# Patient Record
Sex: Female | Born: 1955 | Race: White | Hispanic: No | Marital: Married | Smoking: Never smoker
Health system: Southern US, Community
[De-identification: ages and names within clinical notes are randomized; demographics above are authoritative.]

## PROBLEM LIST (undated history)

## (undated) DIAGNOSIS — F32A Depression, unspecified: Secondary | ICD-10-CM

## (undated) DIAGNOSIS — F329 Major depressive disorder, single episode, unspecified: Secondary | ICD-10-CM

## (undated) DIAGNOSIS — G43109 Migraine with aura, not intractable, without status migrainosus: Secondary | ICD-10-CM

## (undated) DIAGNOSIS — M1990 Unspecified osteoarthritis, unspecified site: Secondary | ICD-10-CM

## (undated) HISTORY — DX: Depression, unspecified: F32.A

## (undated) HISTORY — DX: Major depressive disorder, single episode, unspecified: F32.9

## (undated) HISTORY — DX: Unspecified osteoarthritis, unspecified site: M19.90

## (undated) HISTORY — DX: Migraine with aura, not intractable, without status migrainosus: G43.109

---

## 1979-01-21 HISTORY — PX: CERVICAL CONE BIOPSY: SUR198

## 1997-08-26 ENCOUNTER — Emergency Department (HOSPITAL_COMMUNITY): Admission: EM | Admit: 1997-08-26 | Discharge: 1997-08-27 | Payer: Self-pay | Admitting: Emergency Medicine

## 1997-12-07 ENCOUNTER — Other Ambulatory Visit: Admission: RE | Admit: 1997-12-07 | Discharge: 1997-12-07 | Payer: Self-pay | Admitting: Obstetrics and Gynecology

## 1998-12-12 ENCOUNTER — Encounter (INDEPENDENT_AMBULATORY_CARE_PROVIDER_SITE_OTHER): Payer: Self-pay | Admitting: Specialist

## 1998-12-12 ENCOUNTER — Other Ambulatory Visit: Admission: RE | Admit: 1998-12-12 | Discharge: 1998-12-12 | Payer: Self-pay | Admitting: Obstetrics and Gynecology

## 1999-02-12 ENCOUNTER — Other Ambulatory Visit: Admission: RE | Admit: 1999-02-12 | Discharge: 1999-02-12 | Payer: Self-pay | Admitting: Obstetrics and Gynecology

## 1999-07-12 ENCOUNTER — Encounter: Admission: RE | Admit: 1999-07-12 | Discharge: 1999-07-12 | Payer: Self-pay | Admitting: Obstetrics and Gynecology

## 1999-07-12 ENCOUNTER — Encounter: Payer: Self-pay | Admitting: Obstetrics and Gynecology

## 2001-03-30 ENCOUNTER — Encounter: Payer: Self-pay | Admitting: Family Medicine

## 2001-03-30 ENCOUNTER — Encounter: Admission: RE | Admit: 2001-03-30 | Discharge: 2001-03-30 | Payer: Self-pay | Admitting: Family Medicine

## 2002-04-26 ENCOUNTER — Encounter: Payer: Self-pay | Admitting: Family Medicine

## 2002-04-26 ENCOUNTER — Encounter: Admission: RE | Admit: 2002-04-26 | Discharge: 2002-04-26 | Payer: Self-pay | Admitting: Family Medicine

## 2002-05-09 ENCOUNTER — Encounter: Payer: Self-pay | Admitting: Family Medicine

## 2002-05-09 ENCOUNTER — Encounter: Admission: RE | Admit: 2002-05-09 | Discharge: 2002-05-09 | Payer: Self-pay | Admitting: Family Medicine

## 2002-10-27 ENCOUNTER — Encounter: Admission: RE | Admit: 2002-10-27 | Discharge: 2002-10-27 | Payer: Self-pay | Admitting: Family Medicine

## 2002-10-27 ENCOUNTER — Encounter: Payer: Self-pay | Admitting: Family Medicine

## 2003-02-05 LAB — HM DEXA SCAN: HM Dexa Scan: NORMAL

## 2003-05-10 ENCOUNTER — Encounter: Admission: RE | Admit: 2003-05-10 | Discharge: 2003-05-10 | Payer: Self-pay | Admitting: Family Medicine

## 2003-05-26 ENCOUNTER — Encounter (HOSPITAL_COMMUNITY): Admission: RE | Admit: 2003-05-26 | Discharge: 2003-08-24 | Payer: Self-pay | Admitting: Family Medicine

## 2003-07-21 HISTORY — PX: ENDOMETRIAL ABLATION: SHX621

## 2003-07-21 HISTORY — PX: KNEE ARTHROSCOPY: SUR90

## 2003-08-18 ENCOUNTER — Ambulatory Visit (HOSPITAL_COMMUNITY): Admission: RE | Admit: 2003-08-18 | Discharge: 2003-08-18 | Payer: Self-pay | Admitting: Obstetrics and Gynecology

## 2003-08-18 ENCOUNTER — Ambulatory Visit (HOSPITAL_BASED_OUTPATIENT_CLINIC_OR_DEPARTMENT_OTHER): Admission: RE | Admit: 2003-08-18 | Discharge: 2003-08-18 | Payer: Self-pay | Admitting: Obstetrics and Gynecology

## 2004-09-03 ENCOUNTER — Encounter: Admission: RE | Admit: 2004-09-03 | Discharge: 2004-09-03 | Payer: Self-pay | Admitting: Family Medicine

## 2005-09-05 ENCOUNTER — Encounter: Admission: RE | Admit: 2005-09-05 | Discharge: 2005-09-05 | Payer: Self-pay | Admitting: Obstetrics and Gynecology

## 2005-09-23 ENCOUNTER — Other Ambulatory Visit: Admission: RE | Admit: 2005-09-23 | Discharge: 2005-09-23 | Payer: Self-pay | Admitting: Obstetrics & Gynecology

## 2006-10-05 ENCOUNTER — Other Ambulatory Visit: Admission: RE | Admit: 2006-10-05 | Discharge: 2006-10-05 | Payer: Self-pay | Admitting: Obstetrics & Gynecology

## 2006-10-09 ENCOUNTER — Encounter: Admission: RE | Admit: 2006-10-09 | Discharge: 2006-10-09 | Payer: Self-pay | Admitting: Obstetrics and Gynecology

## 2007-02-05 LAB — HM COLONOSCOPY: HM Colonoscopy: NORMAL

## 2007-10-07 ENCOUNTER — Other Ambulatory Visit: Admission: RE | Admit: 2007-10-07 | Discharge: 2007-10-07 | Payer: Self-pay | Admitting: Obstetrics & Gynecology

## 2007-10-19 ENCOUNTER — Encounter: Admission: RE | Admit: 2007-10-19 | Discharge: 2007-10-19 | Payer: Self-pay | Admitting: Obstetrics and Gynecology

## 2008-05-12 DIAGNOSIS — E559 Vitamin D deficiency, unspecified: Secondary | ICD-10-CM | POA: Insufficient documentation

## 2008-08-25 ENCOUNTER — Encounter: Admission: RE | Admit: 2008-08-25 | Discharge: 2008-08-25 | Payer: Self-pay | Admitting: Family Medicine

## 2008-10-19 ENCOUNTER — Encounter: Admission: RE | Admit: 2008-10-19 | Discharge: 2008-10-19 | Payer: Self-pay | Admitting: Family Medicine

## 2008-10-27 ENCOUNTER — Encounter: Admission: RE | Admit: 2008-10-27 | Discharge: 2008-10-27 | Payer: Self-pay | Admitting: Family Medicine

## 2009-01-15 DIAGNOSIS — F331 Major depressive disorder, recurrent, moderate: Secondary | ICD-10-CM | POA: Insufficient documentation

## 2009-11-14 ENCOUNTER — Encounter: Admission: RE | Admit: 2009-11-14 | Discharge: 2009-11-14 | Payer: Self-pay | Admitting: Family Medicine

## 2009-11-28 ENCOUNTER — Encounter: Admission: RE | Admit: 2009-11-28 | Discharge: 2009-11-28 | Payer: Self-pay | Admitting: Family Medicine

## 2009-11-29 ENCOUNTER — Ambulatory Visit (HOSPITAL_BASED_OUTPATIENT_CLINIC_OR_DEPARTMENT_OTHER): Admission: RE | Admit: 2009-11-29 | Discharge: 2009-11-29 | Payer: Self-pay | Admitting: Urology

## 2009-11-29 HISTORY — PX: BLADDER SUSPENSION: SHX72

## 2010-02-09 ENCOUNTER — Encounter: Payer: Self-pay | Admitting: Family Medicine

## 2010-02-10 ENCOUNTER — Encounter: Payer: Self-pay | Admitting: Family Medicine

## 2010-02-11 ENCOUNTER — Encounter: Payer: Self-pay | Admitting: Family Medicine

## 2010-03-28 ENCOUNTER — Other Ambulatory Visit: Payer: Self-pay | Admitting: Obstetrics and Gynecology

## 2010-03-28 DIAGNOSIS — N6459 Other signs and symptoms in breast: Secondary | ICD-10-CM

## 2010-04-02 LAB — ABO/RH: ABO/RH(D): A POS

## 2010-04-02 LAB — TYPE AND SCREEN
ABO/RH(D): A POS
Antibody Screen: NEGATIVE

## 2010-04-02 LAB — CBC
HCT: 41.9 % (ref 36.0–46.0)
Hemoglobin: 14.6 g/dL (ref 12.0–15.0)
MCH: 31.6 pg (ref 26.0–34.0)
MCHC: 34.9 g/dL (ref 30.0–36.0)
MCV: 90.5 fL (ref 78.0–100.0)
Platelets: 300 10*3/uL (ref 150–400)
RBC: 4.63 MIL/uL (ref 3.87–5.11)
RDW: 13.7 % (ref 11.5–15.5)
WBC: 6.1 10*3/uL (ref 4.0–10.5)

## 2010-04-02 LAB — PROTIME-INR
INR: 1.05 (ref 0.00–1.49)
Prothrombin Time: 13.9 seconds (ref 11.6–15.2)

## 2010-04-02 LAB — APTT: aPTT: 30 seconds (ref 24–37)

## 2010-04-05 ENCOUNTER — Ambulatory Visit
Admission: RE | Admit: 2010-04-05 | Discharge: 2010-04-05 | Disposition: A | Discharge status: Home / Self Care | Payer: BC Managed Care – PPO | Source: Ambulatory Visit | Attending: Obstetrics and Gynecology | Admitting: Obstetrics and Gynecology

## 2010-04-05 ENCOUNTER — Other Ambulatory Visit: Payer: Self-pay | Admitting: Obstetrics and Gynecology

## 2010-04-05 DIAGNOSIS — N6459 Other signs and symptoms in breast: Secondary | ICD-10-CM

## 2010-06-05 LAB — HM PAP SMEAR: HM Pap smear: NORMAL

## 2010-06-05 LAB — HM MAMMOGRAPHY: HM Mammogram: NORMAL

## 2010-06-07 NOTE — Op Note (Signed)
NAME:  Amy Davila, Amy Davila                         ACCOUNT NO.:  [id number]   MEDICAL RECORD NO.:  [id number]                   PATIENT TYPE:  AMB   LOCATION:  [facility]                                 FACILITY:  [facility]   PHYSICIAN:  Laqueta Linden, M.D.                 DATE OF BIRTH:  [birth year]   DATE OF PROCEDURE:  08/18/2003  DATE OF DISCHARGE:                                 OPERATIVE REPORT   PREOPERATIVE DIAGNOSES:  Menorrhagia.   POSTOPERATIVE DIAGNOSES:  Menorrhagia.   PROCEDURE:  Hydrothermal ablation.   SURGEON:  Laqueta Linden, M.D.   ANESTHESIA:  General LMA.   ESTIMATED BLOOD LOSS:  Less than 10 mL.   SORBITOL NET INTAKE:  Zero.   SPECIMENS:  None.   COMPLICATIONS:  None.   INDICATIONS FOR PROCEDURE:  Amy Davila is a 55 year old, para 2,  perimenopausal woman who presented with severe menometrorrhagia consistent  with anovulatory cycles.  She responded to high dose progesterone therapy  with cessation of her bleeding. She had mild anemia with a hemoglobin of  11.5. She reported that this had been going on for the past several months  and getting gradually worse. She was evaluated by office endometrial  sampling which revealed benign disorder proliferation and a small fragment  of a benign polyp. Pelvic ultrasound with sonohysterogram revealed a  retroverted uterus with a suggestion of endometrial polyps which was  confirmed by sonohysterogram revealing two broad based polyps on the  anterior surface of the endometrium measuring 19 x 9 x 13 mm and 18 x 10 x  15 mm.  The patient was offered alternatives of hysteroscopic resection of  the polyps versus endometrial ablation followed by curettage is necessary  and chose the latter. She received Lupron in the luteal phase of her cycle  one month ago and presents now for surgical management. She has seen the  informed consent film, voiced her understanding and acceptance of all risks,  benefits, alternatives and  complications including but not limited to  anesthesia risks, infection, bleeding, uterine perforation, possibility of  incomplete or temporary relief of symptoms with the risk of recurrent  symptoms as well as erratic bleeding related to her age which may get worse  in the future. She has seen the informed consent film regarding operative  hysteroscopy and agrees to proceed.   DESCRIPTION OF PROCEDURE:  The patient was taken to the operating room and  after proper identification and consents were ascertains, she was placed on  the operating table in the supine position. After general LMA was  introduced, she was placed in the [place] stirrups and the perineum and vagina  were prepped and draped in routine sterile fashion. The bladder was emptied  with a red rubber catheter. The uterus on bimanual was noted to be acutely  retroverted, parous in size and mobile. A speculum was placed in the vagina  and the  anterior lip of the cervix was grasped with a single tooth  tenaculum. The internal os was noted to deviated sharply posteriorly and was  gently dilated to a #21 Pratt dilator. The HTA scope was inserted under  direct vision with continuous sorbitol infusion and was a snug fit through  the internal os. Both tubal ostia were visualized. The entire endometrial  cavity was visualized, there was no evidence of a defined polyp and it was  felt that perhaps the Lupron had resolved these. In addition, the patient  gave a history of passing a large piece of tissue during her last bleeding  episode which sounded like at least one of the polyps had been passed  spontaneously. In any event, the endometrial cavity appeared quite atrophic  without focal lesions and well prepared for the surgery. The fluid was  introduced and appropriate pressure measurements were taken to confirm an  intact uterine cavity with no evidence of perforation.  Once this was  confirmed then the fluid was heated to 90 degrees  Celsius and a 10 minute  ablation procedure was performed. There was a very slight drop in the fluid  of approximately 1 mL per minute and this was felt to be due to the uterine  expansion and possibly slight movement of the scope. There was no evidence  at any time of any passage of fluid back through the cervix which was  watched continuously throughout the procedure. At the conclusion of the 10  minute ablation procedure, the fluid was cooled, the scope was moved around  and the entire cavity was felt to have an excellent ablation. Again there  were no focal lesions or polyps identified. The scope was then withdrawn,  the tenaculum site had a slight amount of oozing which responded to local  pressure. All instruments were removed, the patient was awake and stable on  transfer to the PACU.  She received Toradol 30 mg IV, 30 mg IM.  Intraoperatively she will be observed and discharged per anesthesia. She is  to followup in the office in 2-3 weeks time. She is to take Advil or Aleve  as needed for any discomfort and to call for excessive pain, fever, bleeding  or other concerns. She was given routine verbal and written discharge  instructions.                                               Laqueta Linden, M.D.    LKS/MEDQ  D:  08/18/2003  T:  08/18/2003  Job:  [id number]   cc:   Elizabeth Palau, FNP

## 2010-09-05 ENCOUNTER — Encounter: Payer: Self-pay | Admitting: Family Medicine

## 2010-09-05 ENCOUNTER — Ambulatory Visit (INDEPENDENT_AMBULATORY_CARE_PROVIDER_SITE_OTHER): Payer: BC Managed Care – PPO | Admitting: Family Medicine

## 2010-09-05 VITALS — BP 102/72 | HR 72 | Temp 98.1°F | Resp 12 | Ht 64.0 in | Wt 170.0 lb

## 2010-09-05 DIAGNOSIS — Z0000 Encounter for general adult medical examination without abnormal findings: Secondary | ICD-10-CM

## 2010-09-05 LAB — CBC WITH DIFFERENTIAL/PLATELET
Basophils Absolute: 0.1 10*3/uL (ref 0.0–0.1)
Basophils Relative: 1.4 % (ref 0.0–3.0)
Eosinophils Absolute: 0.3 10*3/uL (ref 0.0–0.7)
Eosinophils Relative: 4.6 % (ref 0.0–5.0)
HCT: 44.1 % (ref 36.0–46.0)
Hemoglobin: 14.6 g/dL (ref 12.0–15.0)
Lymphocytes Relative: 27.8 % (ref 12.0–46.0)
Lymphs Abs: 1.7 10*3/uL (ref 0.7–4.0)
MCHC: 33.2 g/dL (ref 30.0–36.0)
MCV: 92.1 fl (ref 78.0–100.0)
Monocytes Absolute: 0.5 10*3/uL (ref 0.1–1.0)
Monocytes Relative: 8.7 % (ref 3.0–12.0)
Neutro Abs: 3.5 10*3/uL (ref 1.4–7.7)
Neutrophils Relative %: 57.5 % (ref 43.0–77.0)
Platelets: 273 10*3/uL (ref 150.0–400.0)
RBC: 4.78 Mil/uL (ref 3.87–5.11)
RDW: 13.4 % (ref 11.5–14.6)
WBC: 6.2 10*3/uL (ref 4.5–10.5)

## 2010-09-05 LAB — HEPATIC FUNCTION PANEL
ALT: 20 U/L (ref 0–35)
AST: 19 U/L (ref 0–37)
Albumin: 4.4 g/dL (ref 3.5–5.2)
Alkaline Phosphatase: 79 U/L (ref 39–117)
Bilirubin, Direct: 0 mg/dL (ref 0.0–0.3)
Total Bilirubin: 0.4 mg/dL (ref 0.3–1.2)
Total Protein: 7.1 g/dL (ref 6.0–8.3)

## 2010-09-05 LAB — POCT URINALYSIS DIPSTICK
Bilirubin, UA: NEGATIVE
Glucose, UA: NEGATIVE
Ketones, UA: NEGATIVE
Nitrite, UA: NEGATIVE
Protein, UA: NEGATIVE
Spec Grav, UA: 1.015
Urobilinogen, UA: 0.2
pH, UA: 8.5

## 2010-09-05 LAB — BASIC METABOLIC PANEL
BUN: 17 mg/dL (ref 6–23)
CO2: 27 mEq/L (ref 19–32)
Calcium: 8.9 mg/dL (ref 8.4–10.5)
Chloride: 101 mEq/L (ref 96–112)
Creatinine, Ser: 0.8 mg/dL (ref 0.4–1.2)
GFR: 84.49 mL/min (ref >60.00)
Glucose, Bld: 84 mg/dL (ref 70–99)
Potassium: 4.3 mEq/L (ref 3.5–5.1)
Sodium: 136 mEq/L (ref 135–145)

## 2010-09-05 LAB — TSH: TSH: 1.38 u[IU]/mL (ref 0.35–5.50)

## 2010-09-05 LAB — LIPID PANEL
Cholesterol: 192 mg/dL (ref 0–200)
HDL: 96.2 mg/dL (ref >39.00)
LDL Cholesterol: 84 mg/dL (ref 0–99)
Total CHOL/HDL Ratio: 2
Triglycerides: 59 mg/dL (ref 0.0–149.0)
VLDL: 11.8 mg/dL (ref 0.0–40.0)

## 2010-09-05 NOTE — Progress Notes (Signed)
  Subjective:    Patient ID: Amy Davila, female    DOB: 06-Oct-1955, 55 y.o.   MRN: [id number]  HPI New to establish care. Patient sees gynecologist regularly for physicals. She is here for wellness exam. Past medical history reviewed. She has some mild osteoarthritis and history of depression which has been stable. Husband died from motor vehicle accident over one year ago. She's had extensive counseling and is exercising and taking antidepressant medications with good success. She continues to work full-time. Allergy to sulfa.  Surgical history previous left knee arthroscopy. She had conization procedure for abnormal Pap in her teen years.  Family history significant for father atrial fibrillation type 2 diabetes. Hypertension in several family members as well as osteoarthritis.  She is widowed. Works as an Airline pilot. 2 children. Nonsmoker. Occasional alcohol use.  Last tetanus 2007. Colonoscopy 2009   Review of Systems  Constitutional: Negative for fever, activity change, appetite change, fatigue and unexpected weight change.  HENT: Negative for hearing loss, ear pain, sore throat and trouble swallowing.   Eyes: Negative for visual disturbance.  Respiratory: Negative for cough and shortness of breath.   Cardiovascular: Negative for chest pain and palpitations.  Gastrointestinal: Negative for abdominal pain, diarrhea, constipation and blood in stool.  Genitourinary: Negative for dysuria and hematuria.  Musculoskeletal: Negative for myalgias, back pain and arthralgias.  Skin: Negative for rash.  Neurological: Negative for dizziness, syncope and headaches.  Hematological: Negative for adenopathy.  Psychiatric/Behavioral: Negative for confusion and dysphoric mood.       Objective:   Physical Exam  Constitutional: She is oriented to person, place, and time. She appears well-developed and well-nourished.  HENT:  Head: Normocephalic and atraumatic.  Eyes: EOM are normal. Pupils are  equal, round, and reactive to light.  Neck: Normal range of motion. Neck supple. No thyromegaly present.  Cardiovascular: Normal rate, regular rhythm and normal heart sounds.   No murmur heard. Pulmonary/Chest: Breath sounds normal. No respiratory distress. She has no wheezes. She has no rales.  Abdominal: Soft. Bowel sounds are normal. She exhibits no distension and no mass. There is no tenderness. There is no rebound and no guarding.  Genitourinary:       As per GYN  Musculoskeletal: Normal range of motion. She exhibits no edema.  Lymphadenopathy:    She has no cervical adenopathy.  Neurological: She is alert and oriented to person, place, and time. She displays normal reflexes. No cranial nerve deficit.  Skin: No rash noted.  Psychiatric: She has a normal mood and affect. Her behavior is normal. Judgment and thought content normal.          Assessment & Plan:  Wellness exam. Obtain screening lab work. Immunizations up-to-date. Continue GYN followup. Colonoscopy up to date. Discussed exercise and weight loss.

## 2010-09-06 NOTE — Progress Notes (Signed)
Quick Note:  Pt informed on personally identified VM ______ 

## 2010-11-25 ENCOUNTER — Telehealth: Payer: Self-pay | Admitting: Family Medicine

## 2010-11-25 MED ORDER — CITALOPRAM HYDROBROMIDE 40 MG PO TABS: 40.0000 mg | ORAL_TABLET | Freq: Every day | ORAL | Status: DC

## 2010-11-25 NOTE — Telephone Encounter (Signed)
Pt requesting refill on citalopram (CELEXA) 40 MG tablet  [facility]

## 2011-07-16 ENCOUNTER — Ambulatory Visit (INDEPENDENT_AMBULATORY_CARE_PROVIDER_SITE_OTHER): Payer: BC Managed Care – PPO | Admitting: Family Medicine

## 2011-07-16 ENCOUNTER — Encounter: Payer: Self-pay | Admitting: Family Medicine

## 2011-07-16 ENCOUNTER — Telehealth: Payer: Self-pay | Admitting: Family Medicine

## 2011-07-16 VITALS — BP 120/82 | Temp 98.1°F | Wt 184.0 lb

## 2011-07-16 DIAGNOSIS — R5383 Other fatigue: Secondary | ICD-10-CM

## 2011-07-16 DIAGNOSIS — R071 Chest pain on breathing: Secondary | ICD-10-CM

## 2011-07-16 DIAGNOSIS — R0789 Other chest pain: Secondary | ICD-10-CM

## 2011-07-16 DIAGNOSIS — R5381 Other malaise: Secondary | ICD-10-CM

## 2011-07-16 NOTE — Telephone Encounter (Signed)
Caller: Amy Davila/Patient; PCP: Evelena Peat; CB#: [phone]; ; ; Call regarding Pain Upper Left Breast Right Under Armpit, onset 6-21.  Afebrile.  Pt has fatigue, sleeping more than normal. All emergent sxs r/o per Breast Symptoms Protocol.  Appt scheduled on 6-26 due to pain in breast and fatigue.  Pt verbalized understanding.

## 2011-07-16 NOTE — Patient Instructions (Addendum)
Touch base for any persistent pain, fatigue, shortness of breath, or any new symptoms.

## 2011-07-16 NOTE — Progress Notes (Signed)
  Subjective:    Patient ID: Amy Davila, female    DOB: 05/30/1955, 56 y.o.   MRN: [id number]  HPI  Acute visit. Left anterior chest wall pain noted about one week ago. Some increased fatigue. Denies any exertional chest pain. No dyspnea. No cough. No fever. Small area of bruising left upper inner arm which may been related to scratching. Otherwise no rash. Pain is sharp to occasional burning and tender to palpation left pectoral region. No history of injury. No radiation to the back.  Pain is relatively mild. Recently started on meloxicam per orthopedist 15 mg daily. Denies abdominal pain. She's also had some nonspecific fatigue. Generally sleeping okay. No dietary changes. Denies any appetite changes. Gradual weight gain past couple of years. Labs including thyroid function normal with physical last year   Review of Systems  Constitutional: Positive for fatigue. Negative for fever, chills, appetite change and unexpected weight change.  Respiratory: Negative for cough and shortness of breath.   Cardiovascular: Negative for palpitations and leg swelling.  Hematological: Negative for adenopathy. Does not bruise/bleed easily.  Psychiatric/Behavioral: Negative for dysphoric mood.       Objective:   Physical Exam  Constitutional: She appears well-developed and well-nourished. No distress.  HENT:  Right Ear: External ear normal.  Left Ear: External ear normal.  Mouth/Throat: Oropharynx is clear and moist.  Neck: Neck supple. No thyromegaly present.  Cardiovascular: Normal rate and regular rhythm.   Pulmonary/Chest: Effort normal and breath sounds normal. No respiratory distress. She has no wheezes. She has no rales.       Left axilla no adenopathy. No left chest wall mass  Musculoskeletal: She exhibits no edema.       Full range of motion left shoulder.  Lymphadenopathy:    She has no cervical adenopathy.  Skin:       Very small area of ecchymosis about 1/2 cm diameter left upper  inner arm otherwise no bruising and no rash          Assessment & Plan:  Patient presents with nonspecific complaints of fatigue for the past week and left chest wall pain. She has some soreness to touch but no mass or any other concerning skin changes. Suspect musculoskeletal origin.  No exertional symptoms. Observe for now. Continue anti-inflammatory. Touch base one week if not improving.

## 2011-08-12 ENCOUNTER — Other Ambulatory Visit: Payer: Self-pay | Admitting: Family Medicine

## 2011-10-31 ENCOUNTER — Other Ambulatory Visit: Payer: Self-pay | Admitting: *Deleted

## 2011-10-31 MED ORDER — BUPROPION HCL ER (XL) 300 MG PO TB24: 300.0000 mg | ORAL_TABLET | ORAL | Status: DC

## 2011-11-01 LAB — HM MAMMOGRAPHY: HM Mammogram: NORMAL

## 2012-02-18 ENCOUNTER — Telehealth: Payer: Self-pay | Admitting: Internal Medicine

## 2012-02-18 NOTE — Telephone Encounter (Signed)
Please send copy of yesterday note to dermatologist  Dr Orson Aloe.  thanks

## 2012-02-19 NOTE — Telephone Encounter (Signed)
Wrong pt. Paperwork sent on correct pt.

## 2012-02-20 ENCOUNTER — Other Ambulatory Visit: Payer: Self-pay | Admitting: Family Medicine

## 2012-03-24 ENCOUNTER — Other Ambulatory Visit: Payer: Self-pay | Admitting: Family Medicine

## 2012-05-01 LAB — HM PAP SMEAR: HM Pap smear: NEGATIVE

## 2012-05-05 ENCOUNTER — Other Ambulatory Visit: Payer: Self-pay | Admitting: Family Medicine

## 2012-06-09 ENCOUNTER — Telehealth: Payer: Self-pay | Admitting: Nurse Practitioner

## 2012-06-09 NOTE — Telephone Encounter (Signed)
LMTCB

## 2012-06-09 NOTE — Telephone Encounter (Signed)
Patient called request medication for anxiety due to a breakup of one year relationship . Requesting something to help her get through this and be productive at work. States her husband passed away 3 year ago and had gotten "back out there". States she does see a therapist but he is not a doctor to prescribe medication. Please advise.

## 2012-06-09 NOTE — Telephone Encounter (Signed)
Patient called to see if she would be a good candidate for anxiety medication.

## 2012-06-10 NOTE — Telephone Encounter (Signed)
Has her therapist recommended any med's?  If not have patient see PCP to prescribe these med's.

## 2012-06-10 NOTE — Telephone Encounter (Signed)
Spoke with pt about whether therapist had suggested any meds. She states she has not seen therapist since the breakup. Pt does have PCP, however. Instructed PG recommends pt see PCP to ask about meds. Pt agreeable and will make appt.

## 2012-06-25 ENCOUNTER — Other Ambulatory Visit (INDEPENDENT_AMBULATORY_CARE_PROVIDER_SITE_OTHER): Payer: BC Managed Care – PPO

## 2012-06-25 DIAGNOSIS — Z0000 Encounter for general adult medical examination without abnormal findings: Secondary | ICD-10-CM

## 2012-06-25 LAB — HEPATIC FUNCTION PANEL
ALT: 19 U/L (ref 0–35)
AST: 18 U/L (ref 0–37)
Albumin: 3.8 g/dL (ref 3.5–5.2)
Alkaline Phosphatase: 60 U/L (ref 39–117)
Bilirubin, Direct: 0 mg/dL (ref 0.0–0.3)
Total Bilirubin: 0.7 mg/dL (ref 0.3–1.2)
Total Protein: 6.6 g/dL (ref 6.0–8.3)

## 2012-06-25 LAB — CBC WITH DIFFERENTIAL/PLATELET
Basophils Absolute: 0.1 10*3/uL (ref 0.0–0.1)
Basophils Relative: 1.1 % (ref 0.0–3.0)
Eosinophils Absolute: 0.2 10*3/uL (ref 0.0–0.7)
Eosinophils Relative: 3.5 % (ref 0.0–5.0)
HCT: 41.9 % (ref 36.0–46.0)
Hemoglobin: 14 g/dL (ref 12.0–15.0)
Lymphocytes Relative: 34.3 % (ref 12.0–46.0)
Lymphs Abs: 2.4 10*3/uL (ref 0.7–4.0)
MCHC: 33.4 g/dL (ref 30.0–36.0)
MCV: 93.9 fl (ref 78.0–100.0)
Monocytes Absolute: 0.6 10*3/uL (ref 0.1–1.0)
Monocytes Relative: 8.5 % (ref 3.0–12.0)
Neutro Abs: 3.7 10*3/uL (ref 1.4–7.7)
Neutrophils Relative %: 52.6 % (ref 43.0–77.0)
Platelets: 283 10*3/uL (ref 150.0–400.0)
RBC: 4.46 Mil/uL (ref 3.87–5.11)
RDW: 14.5 % (ref 11.5–14.6)
WBC: 7.1 10*3/uL (ref 4.5–10.5)

## 2012-06-25 LAB — POCT URINALYSIS DIPSTICK
Bilirubin, UA: NEGATIVE
Glucose, UA: NEGATIVE
Ketones, UA: NEGATIVE
Nitrite, UA: NEGATIVE
Protein, UA: NEGATIVE
Spec Grav, UA: >=1.03
Urobilinogen, UA: 0.2
pH, UA: 5.5

## 2012-06-25 LAB — LDL CHOLESTEROL, DIRECT: Direct LDL: 79.7 mg/dL

## 2012-06-25 LAB — LIPID PANEL
Cholesterol: 206 mg/dL — ABNORMAL HIGH (ref 0–200)
HDL: 106.6 mg/dL (ref >39.00)
Total CHOL/HDL Ratio: 2
Triglycerides: 55 mg/dL (ref 0.0–149.0)
VLDL: 11 mg/dL (ref 0.0–40.0)

## 2012-06-25 LAB — BASIC METABOLIC PANEL
BUN: 18 mg/dL (ref 6–23)
CO2: 23 mEq/L (ref 19–32)
Calcium: 8.9 mg/dL (ref 8.4–10.5)
Chloride: 107 mEq/L (ref 96–112)
Creatinine, Ser: 0.7 mg/dL (ref 0.4–1.2)
GFR: 95.42 mL/min (ref >60.00)
Glucose, Bld: 73 mg/dL (ref 70–99)
Potassium: 4 mEq/L (ref 3.5–5.1)
Sodium: 140 mEq/L (ref 135–145)

## 2012-06-25 LAB — TSH: TSH: 1.62 u[IU]/mL (ref 0.35–5.50)

## 2012-06-27 ENCOUNTER — Encounter: Payer: Self-pay | Admitting: Family Medicine

## 2012-06-27 ENCOUNTER — Ambulatory Visit (INDEPENDENT_AMBULATORY_CARE_PROVIDER_SITE_OTHER): Payer: BC Managed Care – PPO | Admitting: Emergency Medicine

## 2012-06-27 VITALS — BP 124/79 | HR 75 | Temp 98.0°F | Resp 17 | Ht 66.5 in | Wt 189.0 lb

## 2012-06-27 DIAGNOSIS — S0990XA Unspecified injury of head, initial encounter: Secondary | ICD-10-CM

## 2012-06-27 DIAGNOSIS — S0181XA Laceration without foreign body of other part of head, initial encounter: Secondary | ICD-10-CM

## 2012-06-27 DIAGNOSIS — S0180XA Unspecified open wound of other part of head, initial encounter: Secondary | ICD-10-CM

## 2012-06-27 NOTE — Progress Notes (Signed)
VCO. Dermabond applied to wound. Edges well approximated. Patient tolerated well.

## 2012-06-27 NOTE — Progress Notes (Signed)
[facility] [address] [place] [address] [phone]  Date:  06/27/2012   Name:  Amy Davila   DOB:  February 15, 1955   MRN:  [id number]  PCP:  Kristian Covey, MD    Chief Complaint: Head Injury   History of Present Illness:  Amy Davila is a 57 y.o. very pleasant female patient who presents with the following:  Injured when she fell out of bed this morning and struck her head.  No LOC.  No neuro or visual symptoms.  No headache or neck pain. No improvement with over the counter medications or other home remedies. Denies other complaint or health concern today..  Current on TD   There are no active problems to display for this patient.   Past Medical History  Diagnosis Date  . Arthritis   . Depression     Past Surgical History  Procedure Laterality Date  . Knee arthroscopy    . Cervical cone biopsy      History  Substance Use Topics  . Smoking status: Never Smoker   . Smokeless tobacco: Not on file  . Alcohol Use: No    Family History  Problem Relation Age of Onset  . Arthritis Mother   . Hypertension Mother   . Arthritis Father   . Hypertension Father   . Diabetes Father     type ll  . Heart disease Father     a fib  . Cancer Maternal Grandmother     lung    Allergies  Allergen Reactions  . Sulfa Antibiotics     Yeast infection occurs    Medication list has been reviewed and updated.  Current Outpatient Prescriptions on File Prior to Visit  Medication Sig Dispense Refill  . buPROPion (WELLBUTRIN XL) 300 MG 24 hr tablet TAKE ONE TABLET BY MOUTH EVERY MORNING  90 tablet  0  . calcium-vitamin D (OSCAL WITH D) 500-200 MG-UNIT per tablet Take 1 tablet by mouth daily.        . citalopram (CELEXA) 40 MG tablet TAKE ONE TABLET BY MOUTH ONE TIME DAILY  30 tablet  4   No current facility-administered medications on file prior to visit.    Review of Systems:  As per HPI, otherwise negative.    Physical  Examination: Filed Vitals:   06/27/12 1240  BP: 124/79  Pulse: 75  Temp: 98 F (36.7 C)  Resp: 17   Filed Vitals:   06/27/12 1240  Height: 5' 6.5" (1.689 m)  Weight: 189 lb (85.73 kg)   Body mass index is 30.05 kg/(m^2). Ideal Body Weight: Weight in (lb) to have BMI = 25: 156.9   GEN: WDWN, NAD, Non-toxic, Alert & Oriented x 3 HEENT: Transverse 2 cm laceration forehead.  No FB, Normocephalic.  Ears and Nose: No external deformity. EXTR: No clubbing/cyanosis/edema NEURO: Normal gait.  AAOx3.  MAE.  PRRERLA EOMI PSYCH: Normally interactive. Conversant. Not depressed or anxious appearing.  Calm demeanor.    Assessment and Plan: Laceration forehead   Signed,  Phillips Odor, MD

## 2012-06-27 NOTE — Patient Instructions (Addendum)

## 2012-07-01 ENCOUNTER — Ambulatory Visit (INDEPENDENT_AMBULATORY_CARE_PROVIDER_SITE_OTHER): Payer: BC Managed Care – PPO | Admitting: Family Medicine

## 2012-07-01 ENCOUNTER — Encounter: Payer: Self-pay | Admitting: Family Medicine

## 2012-07-01 VITALS — BP 110/78 | HR 72 | Temp 98.2°F | Resp 12 | Ht 64.75 in | Wt 188.0 lb

## 2012-07-01 DIAGNOSIS — Z0000 Encounter for general adult medical examination without abnormal findings: Secondary | ICD-10-CM

## 2012-07-01 LAB — URINALYSIS, ROUTINE W REFLEX MICROSCOPIC
Bilirubin Urine: NEGATIVE
Ketones, ur: NEGATIVE
Nitrite: NEGATIVE
RBC / HPF: NONE SEEN (ref 0–?)
Specific Gravity, Urine: 1.015 (ref 1.000–1.030)
Total Protein, Urine: NEGATIVE
Urine Glucose: NEGATIVE
Urobilinogen, UA: 0.2 (ref 0.0–1.0)
pH: 6.5 (ref 5.0–8.0)

## 2012-07-01 LAB — POCT URINALYSIS DIPSTICK
Bilirubin, UA: NEGATIVE
Glucose, UA: NEGATIVE
Ketones, UA: NEGATIVE
Nitrite, UA: NEGATIVE
Protein, UA: NEGATIVE
Spec Grav, UA: 1.01
Urobilinogen, UA: 0.2
pH, UA: 7

## 2012-07-01 MED ORDER — BUPROPION HCL ER (XL) 300 MG PO TB24: 300.0000 mg | ORAL_TABLET | Freq: Every day | ORAL | Status: DC

## 2012-07-01 MED ORDER — CITALOPRAM HYDROBROMIDE 40 MG PO TABS: 40.0000 mg | ORAL_TABLET | Freq: Every day | ORAL | Status: DC

## 2012-07-01 NOTE — Progress Notes (Signed)
  Subjective:    Patient ID: Amy Davila, female    DOB: 25-Feb-1955, 57 y.o.   MRN: [id number]  HPI Patient here for complete physical She sees gynecologist regularly. She has history of depression currently stable on Wellbutrin and Celexa. Currently not exercising. Some intermittent insomnia issues. No other chronic problems. Nonsmoker. Inconsistent calcium and vitamin D intake. DEXA scan from 2005 reportedly normal. She is getting yearly mammograms. Immunizations up to date.  Past Medical History  Diagnosis Date  . Arthritis   . Depression    Past Surgical History  Procedure Laterality Date  . Knee arthroscopy    . Cervical cone biopsy      reports that she has never smoked. She does not have any smokeless tobacco history on file. She reports that she does not drink alcohol or use illicit drugs. family history includes Arthritis in her father and mother; Cancer in her maternal grandmother; Diabetes in her father; Heart disease in her father; and Hypertension in her father and mother. Allergies  Allergen Reactions  . Sulfa Antibiotics     Yeast infection occurs      Review of Systems  Constitutional: Negative for fever, activity change, appetite change, fatigue and unexpected weight change.  HENT: Negative for hearing loss, ear pain, sore throat and trouble swallowing.   Eyes: Negative for visual disturbance.  Respiratory: Negative for cough and shortness of breath.   Cardiovascular: Negative for chest pain and palpitations.  Gastrointestinal: Negative for abdominal pain, diarrhea, constipation and blood in stool.  Endocrine: Negative for polydipsia and polyuria.  Genitourinary: Negative for dysuria and hematuria.  Musculoskeletal: Negative for myalgias, back pain and arthralgias.  Skin: Negative for rash.  Neurological: Negative for dizziness, syncope and headaches.  Hematological: Negative for adenopathy.  Psychiatric/Behavioral: Negative for confusion and dysphoric  mood.       Objective:   Physical Exam  Constitutional: She is oriented to person, place, and time. She appears well-developed and well-nourished.  HENT:  Head: Normocephalic and atraumatic.  Eyes: EOM are normal. Pupils are equal, round, and reactive to light.  Neck: Normal range of motion. Neck supple. No thyromegaly present.  Cardiovascular: Normal rate, regular rhythm and normal heart sounds.   No murmur heard. Pulmonary/Chest: Breath sounds normal. No respiratory distress. She has no wheezes. She has no rales.  Abdominal: Soft. Bowel sounds are normal. She exhibits no distension and no mass. There is no tenderness. There is no rebound and no guarding.  Genitourinary:  Per gyn  Musculoskeletal: Normal range of motion. She exhibits no edema.  Lymphadenopathy:    She has no cervical adenopathy.  Neurological: She is alert and oriented to person, place, and time. She displays normal reflexes. No cranial nerve deficit.  Skin: No rash noted.  Psychiatric: She has a normal mood and affect. Her behavior is normal. Judgment and thought content normal.          Assessment & Plan:  Complete physical. Labs reviewed. She has hematuria on dipstick. Repeat urine. Urine micro-if still positive. Increase calcium and vitamin D. Increase weightbearing exercise.

## 2012-07-01 NOTE — Patient Instructions (Signed)
Calcium 1200 mg daily and Vit D 800-1,000 IU per day

## 2012-09-28 ENCOUNTER — Telehealth: Payer: Self-pay | Admitting: Family Medicine

## 2012-09-28 NOTE — Telephone Encounter (Signed)
Patient Information:  Caller Name: Keyandra  Phone: [phone]  Patient: Amy Davila, Amy Davila  Gender: Female  DOB: 1955/05/10  Age: 57 Years  PCP: Evelena Peat [facility])  Pregnant: No  Office Follow Up:  Does the office need to follow up with this patient?: No  Instructions For The Office: N/A   Symptoms  Reason For Call & Symptoms: Started with allergy sx last week and has congestion and cough. Cough is deep and has moved into chest and she is coughing up greenish phlegm. She started taking Allegra D on 09/25/12 and not really helping. Leaving to travel to [facility] on 10/02/12 and is wanting to be checked.  Reviewed Health History In EMR: Yes  Reviewed Medications In EMR: Yes  Reviewed Allergies In EMR: Yes  Reviewed Surgeries / Procedures: Yes  Date of Onset of Symptoms: 09/21/2012  Treatments Tried: Allergra D.  Treatments Tried Worked: No OB / GYN:  LMP: Unknown  Guideline(s) Used:  Cough  Disposition Per Guideline:   See Today or Tomorrow in Office  Reason For Disposition Reached:   Patient wants to be seen  Advice Given:  Reassurance  Coughing is the way that our lungs remove irritants and mucus. It helps protect our lungs from getting pneumonia.  You can get a dry hacking cough after a chest cold. Sometimes this type of cough can last 1-3 weeks, and be worse at night.  You can also get a cough after being exposed to irritating substances like smoke, strong perfumes, and dust.  Coughing Spasms:  Drink warm fluids. Inhale warm mist (Reason: both relax the airway and loosen up the phlegm).  Suck on cough drops or hard candy to coat the irritated throat.  Prevent Dehydration:  Drink adequate liquids.  This will help soothe an irritated or dry throat and loosen up the phlegm.  Call Back If:  Difficulty breathing  Cough lasts more than 3 weeks  Fever lasts > 3 days  You become worse.  Patient Will Follow Care Advice:  YES  Appointment Scheduled:  09/29/2012 09:45:00 Appointment Scheduled Provider:  Evelena Peat [facility])

## 2012-09-29 ENCOUNTER — Encounter: Payer: Self-pay | Admitting: Family Medicine

## 2012-09-29 ENCOUNTER — Ambulatory Visit (INDEPENDENT_AMBULATORY_CARE_PROVIDER_SITE_OTHER): Payer: BC Managed Care – PPO | Admitting: Family Medicine

## 2012-09-29 VITALS — BP 124/78 | HR 67 | Temp 97.5°F | Wt 192.0 lb

## 2012-09-29 DIAGNOSIS — J209 Acute bronchitis, unspecified: Secondary | ICD-10-CM

## 2012-09-29 MED ORDER — BENZONATATE 200 MG PO CAPS: 200.0000 mg | ORAL_CAPSULE | Freq: Three times a day (TID) | ORAL | Status: DC | PRN

## 2012-09-29 MED ORDER — AZITHROMYCIN 250 MG PO TABS: ORAL_TABLET | ORAL | Status: DC

## 2012-09-29 NOTE — Patient Instructions (Addendum)

## 2012-09-29 NOTE — Progress Notes (Signed)
  Subjective:    Patient ID: Amy Davila, female    DOB: [birth year], 57 y.o.   MRN: [id number]  HPI Acute visit. Patient presents with respiratory illness. Patient has cough for about one week. No wheezing. No fever. Some nasal congestion improved with Claritin-D. She is sleeping okay. Cough is worse during the day. Denies any dyspnea. Has some mild increased malaise. Denies any sore throat. No history of smoking  Past Medical History  Diagnosis Date  . Arthritis   . Depression    Past Surgical History  Procedure Laterality Date  . Knee arthroscopy    . Cervical cone biopsy      reports that she has never smoked. She does not have any smokeless tobacco history on file. She reports that she does not drink alcohol or use illicit drugs. family history includes Arthritis in her father and mother; Cancer in her maternal grandmother; Diabetes in her father; Heart disease in her father; Hypertension in her father and mother. Allergies  Allergen Reactions  . Sulfa Antibiotics     Yeast infection occurs      Review of Systems  Constitutional: Negative for fever and chills.  HENT: Positive for congestion.   Respiratory: Positive for cough. Negative for shortness of breath and wheezing.   Neurological: Negative for headaches.       Objective:   Physical Exam  Constitutional: She appears well-developed and well-nourished.  HENT:  Right Ear: External ear normal.  Left Ear: External ear normal.  Mouth/Throat: Oropharynx is clear and moist.  Neck: Neck supple.  Cardiovascular: Normal rate and regular rhythm.   Pulmonary/Chest: Effort normal and breath sounds normal. No respiratory distress. She has no wheezes. She has no rales.  Lymphadenopathy:    She has no cervical adenopathy.          Assessment & Plan:  Cough probably secondary to acute viral bronchitis. Tessalon Perles 200 mg every 8 hours as needed for cough. If she develops any fever or worsening symptoms consider  Zithromax

## 2012-10-19 ENCOUNTER — Telehealth: Payer: Self-pay | Admitting: Family Medicine

## 2012-10-19 NOTE — Telephone Encounter (Signed)
PT states that she is in [facility], because of her father in Radi[employer], therapeutic health. She states that she left abruptly, and they are expecting him to pass this afternoon. She only needs a 5 day supply, but she left without her citalopram (CELEXA) 40 MG tablet, andbuPROPion (WELLBUTRIN XL) 300 MG 24 hr tablet. She would like them sent to [facility], [place], [address], Phone [phone]. Please assist.

## 2012-10-20 MED ORDER — BUPROPION HCL ER (XL) 300 MG PO TB24: 300.0000 mg | ORAL_TABLET | Freq: Every day | ORAL | Status: DC

## 2012-10-20 MED ORDER — CITALOPRAM HYDROBROMIDE 40 MG PO TABS: 40.0000 mg | ORAL_TABLET | Freq: Every day | ORAL | Status: DC

## 2012-10-20 NOTE — Telephone Encounter (Signed)
Sent RX's to the pharmacy

## 2012-11-25 ENCOUNTER — Other Ambulatory Visit: Payer: Self-pay

## 2013-01-26 ENCOUNTER — Encounter: Payer: Self-pay | Admitting: Gynecology

## 2013-01-26 ENCOUNTER — Encounter: Payer: Self-pay | Admitting: Nurse Practitioner

## 2013-01-26 ENCOUNTER — Ambulatory Visit (INDEPENDENT_AMBULATORY_CARE_PROVIDER_SITE_OTHER): Payer: 59 | Admitting: Gynecology

## 2013-01-26 VITALS — BP 122/72 | Temp 98.0°F | Resp 16 | Ht 64.75 in | Wt 193.0 lb

## 2013-01-26 DIAGNOSIS — G43109 Migraine with aura, not intractable, without status migrainosus: Secondary | ICD-10-CM | POA: Insufficient documentation

## 2013-01-26 DIAGNOSIS — R300 Dysuria: Secondary | ICD-10-CM

## 2013-01-26 LAB — POCT URINALYSIS DIPSTICK
Blood, UA: 2
Urobilinogen, UA: NEGATIVE
pH, UA: 5

## 2013-01-26 MED ORDER — PHENAZOPYRIDINE HCL 200 MG PO TABS: 200.0000 mg | ORAL_TABLET | Freq: Three times a day (TID) | ORAL | Status: DC | PRN

## 2013-01-26 MED ORDER — CIPROFLOXACIN HCL 500 MG PO TABS: 500.0000 mg | ORAL_TABLET | Freq: Two times a day (BID) | ORAL | Status: DC

## 2013-01-26 NOTE — Patient Instructions (Signed)
Place urinary tract infection patient instructions here.

## 2013-01-26 NOTE — Progress Notes (Signed)
  Subjective:    Amy Davila is a 58 y.o. female who complains of burning with urination, foul smelling urine, hesitancy and urgency. She has had symptoms for 2 days. Patient also complains of none. Patient denies back pain and fever. Patient does have a history of recurrent UTI. Patient has  a remote history of pyelonephritis.   The following portions of the patient's history were reviewed and updated as appropriate: allergies, current medications, past family history, past medical history, past social history, past surgical history and problem list.  Review of Systems Pertinent items are noted in HPI.    Objective:    BP 122/72  Temp(Src) 98 F (36.7 C)  Resp 16  Ht 5' 4.75" (1.645 m)  Wt 193 lb (87.544 kg)  BMI 32.35 kg/m2 General appearance: alert, cooperative and appears stated age Back: no CVAT Abdomen: soft nontender, no suprapubic tenderness  Laboratory:  Urine dipstick: 2+ for leukocyte esterase and 2+ blood.   Micro exam: not done.    Assessment:    Acute cystitis     Plan:    Medications: ciprofloxacin. Maintain adequate hydration. Follow up if symptoms not improving, and as needed.

## 2013-01-27 LAB — URINALYSIS, MICROSCOPIC ONLY
Bacteria, UA: NONE SEEN
Casts: NONE SEEN
Crystals: NONE SEEN
WBC, UA: >50 WBC/hpf — AB (ref <3)

## 2013-01-29 LAB — URINE CULTURE: Colony Count: 85000

## 2013-02-08 ENCOUNTER — Encounter: Payer: Self-pay | Admitting: Nurse Practitioner

## 2013-02-08 ENCOUNTER — Ambulatory Visit (INDEPENDENT_AMBULATORY_CARE_PROVIDER_SITE_OTHER): Payer: 59 | Admitting: Nurse Practitioner

## 2013-02-08 VITALS — BP 100/66 | HR 72 | Ht 65.0 in | Wt 194.0 lb

## 2013-02-08 DIAGNOSIS — Z01419 Encounter for gynecological examination (general) (routine) without abnormal findings: Secondary | ICD-10-CM

## 2013-02-08 DIAGNOSIS — Z0000 Encounter for general adult medical examination without abnormal findings: Secondary | ICD-10-CM

## 2013-02-08 DIAGNOSIS — Z23 Encounter for immunization: Secondary | ICD-10-CM

## 2013-02-08 DIAGNOSIS — N390 Urinary tract infection, site not specified: Secondary | ICD-10-CM

## 2013-02-08 LAB — POCT URINALYSIS DIPSTICK
Bilirubin, UA: NEGATIVE
Glucose, UA: NEGATIVE
Ketones, UA: NEGATIVE
Nitrite, UA: NEGATIVE
Protein, UA: NEGATIVE
Urobilinogen, UA: NEGATIVE
pH, UA: 8

## 2013-02-08 MED ORDER — FLUCONAZOLE 150 MG PO TABS: 150.0000 mg | ORAL_TABLET | Freq: Once | ORAL | Status: DC

## 2013-02-08 MED ORDER — NITROFURANTOIN MONOHYD MACRO 100 MG PO CAPS: 100.0000 mg | ORAL_CAPSULE | Freq: Two times a day (BID) | ORAL | Status: DC

## 2013-02-08 MED ORDER — NYSTATIN-TRIAMCINOLONE 100000-0.1 UNIT/GM-% EX OINT: 1.0000 "application " | TOPICAL_OINTMENT | Freq: Two times a day (BID) | CUTANEOUS | Status: DC

## 2013-02-08 NOTE — Patient Instructions (Signed)

## 2013-02-08 NOTE — Progress Notes (Signed)
Patient ID: Amy BatheJanet W Davila, female   DOB: 06/30/1955, 58 y.o.   MRN: [id number]  58 y.o. [id number] Widowed Caucasian Fe here for annual exam.  She had a recent UTI treated 01/26/13 with Cipro.  Symptoms some better but after finishing antibiotics symptoms restarted. Mostly has dysuria and urgency.  She just returned from a cruise and noted an increase in vulvar burning that maybe related to a change in body wash.  About this same time had a pain in the right buttock area and now it is 'numb' feeling.  She denies any trauma to buttock but does think lack of Yoga exercise is making this worse.  No back pain.  Same partner for 18 months.  He is of [place] descent and the father in law lives with him and his 2 children to help raise them after his wife passed away. So many cultural differences.  No LMP recorded. Patient is postmenopausal.          Sexually active: yes  The current method of family planning is post menopausal status.    Exercising: no  The patient does not participate in regular exercise at present. Smoker:  no  Health Maintenance: Pap: 02/05/12, WNL, neg HR HPV MMG: 03/2010 Colonoscopy:  12/23/07, diverticula, repeat in 5-10 years BMD: 5+ years ago TDaP:  2005 Labs:  PCP Urine:  Trace leuk's, trace RBC, pH 8.0   reports that she has never smoked. She does not have any smokeless tobacco history on file. She reports that she does not drink alcohol or use illicit drugs.  Past Medical History  Diagnosis Date  . Arthritis   . Depression   . Migraine with aura     Past Surgical History  Procedure Laterality Date  . Knee arthroscopy Left 07/2003  . 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 Ablation  07/2003    HTA  . Bladder suspension  11/29/09    with pelvic hematoma    Current Outpatient Prescriptions  Medication Sig Dispense Refill  . buPROPion (WELLBUTRIN XL) 300 MG 24 hr tablet Take 1 tablet (300 mg total) by mouth daily.  5 tablet  0  . calcium-vitamin D (OSCAL WITH D)  500-200 MG-UNIT per tablet Take 1 tablet by mouth daily.        . citalopram (CELEXA) 40 MG tablet Take 1 tablet (40 mg total) by mouth daily.  5 tablet  0  . fluconazole (DIFLUCAN) 150 MG tablet Take 1 tablet (150 mg total) by mouth once. Take one tablet.  Repeat in 48 hours if symptoms are not completely resolved.  2 tablet  0  . nitrofurantoin, macrocrystal-monohydrate, (MACROBID) 100 MG capsule Take 1 capsule (100 mg total) by mouth 2 (two) times daily.  14 capsule  0  . nystatin-triamcinolone ointment (MYCOLOG) Apply 1 application topically 2 (two) times daily.  30 g  0   No current facility-administered medications for this visit.    Family History  Problem Relation Age of Onset  . Arthritis Mother   . Hypertension Mother   . Arthritis Father   . Hypertension Father   . Diabetes Father     type ll  . Heart disease Father     a fib  . Cancer Maternal Grandmother     lung  . Hypertension Maternal Grandmother     ROS:  Pertinent items are noted in HPI.  Otherwise, a comprehensive ROS was negative.  Exam:   BP 100/66  Pulse  72  Ht 5\' 5"  (1.651 m)  Wt 194 lb (87.998 kg)  BMI 32.28 kg/m2 Height: 5\' 5"  (165.1 cm)  Ht Readings from Last 3 Encounters:  02/08/13 5\' 5"  (1.651 m)  01/26/13 5' 4.75" (1.645 m)  07/01/12 5' 4.75" (1.645 m)    General appearance: alert, cooperative and appears stated age Head: Normocephalic, without obvious abnormality, atraumatic Neck: no adenopathy, supple, symmetrical, trachea midline and thyroid normal to inspection and palpation Lungs: clear to auscultation bilaterally Breasts: normal appearance, no masses or tenderness Heart: regular rate and rhythm Abdomen: soft, non-tender; no masses,  no organomegaly  Extremities: extremities normal, atraumatic, no cyanosis or edema, no pain on ROM of right lower leg.  No back pain to palpation. Pain at right SI joint space.  Skin: Skin color, texture, turgor normal. No rashes or lesions Lymph nodes:  Cervical, supraclavicular, and axillary nodes normal. No abnormal inguinal nodes palpated Neurologic: Grossly normal   Pelvic: External genitalia:  Areas of redness between labia minora and majora that are red and irritated. No other lesions. At area of pain and numbness she has no redness but there is minimal swelling and pain with palpation.                Urethra:  normal appearing urethra with no masses, tenderness or lesions              Bartholin's and Skene's: normal                 Vagina: normal appearing vagina with normal color and discharge, no lesions              Cervix: anteverted              Pap taken: no Bimanual Exam:  Uterus:  normal size, contour, position, consistency, mobility, non-tender              Adnexa: no mass, fullness, tenderness               Rectovaginal: Confirms               Anus:  normal sphincter tone, no lesions, no rectal mass  A:  Well Woman with normal exam  Postmenopausal   Update immunization  R/O recurrent UTI  Pain/ numbness right buttock ? Etiology  Vulvar irritation - maybe related to yeast  P:   Pap smear as per guidelines   Mammogram is past due and she will schedule  TDaP given today  Diflucan 150 mg given to see if vulvar changes improve.  Will start on Macrobid 100 mg awaiting culture and micro results  Advised to see Ortho about pain right SI joint and numbness  Counseled on breast self exam, mammography screening, adequate intake of calcium and vitamin D, diet and exercise, Kegel's exercises return annually or prn  An After Visit Summary was printed and given to the patient.

## 2013-02-09 LAB — URINALYSIS, MICROSCOPIC ONLY
Bacteria, UA: NONE SEEN
Casts: NONE SEEN
Crystals: NONE SEEN
Squamous Epithelial / LPF: NONE SEEN

## 2013-02-10 LAB — URINE CULTURE: Colony Count: 40000

## 2013-02-11 NOTE — Progress Notes (Signed)
Encounter reviewed by Dr. Kaytlin Burklow Silva.  

## 2013-02-21 ENCOUNTER — Telehealth: Payer: Self-pay | Admitting: *Deleted

## 2013-02-21 ENCOUNTER — Encounter: Payer: Self-pay | Admitting: *Deleted

## 2013-02-21 ENCOUNTER — Ambulatory Visit (INDEPENDENT_AMBULATORY_CARE_PROVIDER_SITE_OTHER): Payer: 59 | Admitting: *Deleted

## 2013-02-21 VITALS — BP 110/66 | HR 72 | Ht 65.0 in | Wt 195.0 lb

## 2013-02-21 DIAGNOSIS — N390 Urinary tract infection, site not specified: Secondary | ICD-10-CM

## 2013-02-21 MED ORDER — FLUCONAZOLE 150 MG PO TABS: 150.0000 mg | ORAL_TABLET | Freq: Once | ORAL | Status: DC

## 2013-02-21 NOTE — Telephone Encounter (Signed)
Pt came today for urine TOC.  She has recently been on Cipro and Macrobid.  Pt is now complaining of vaginal burning.  Would like to know if she can have something for yeast infection. Please advise.

## 2013-02-21 NOTE — Telephone Encounter (Signed)
Pt notified RX has been called in.  Directions explained.

## 2013-02-21 NOTE — Progress Notes (Signed)
Patient ID: [name], female   DOB: 12/14/1955, 58 y.o.   MRN: [id number] Pt returned for urine TOC.  She completed antibiotics.  She is no longer having urinary symptoms. Urine sent for culture.

## 2013-02-23 LAB — URINE CULTURE
Colony Count: NO GROWTH
Organism ID, Bacteria: NO GROWTH

## 2013-07-03 ENCOUNTER — Other Ambulatory Visit: Payer: Self-pay | Admitting: Family Medicine

## 2013-07-20 ENCOUNTER — Other Ambulatory Visit: Payer: Self-pay | Admitting: Family Medicine

## 2013-11-14 ENCOUNTER — Other Ambulatory Visit: Payer: Self-pay | Admitting: Family Medicine

## 2013-11-17 ENCOUNTER — Other Ambulatory Visit (INDEPENDENT_AMBULATORY_CARE_PROVIDER_SITE_OTHER): Payer: BC Managed Care – PPO

## 2013-11-17 DIAGNOSIS — R300 Dysuria: Secondary | ICD-10-CM

## 2013-11-17 DIAGNOSIS — Z0000 Encounter for general adult medical examination without abnormal findings: Secondary | ICD-10-CM

## 2013-11-17 LAB — CBC WITH DIFFERENTIAL/PLATELET
Basophils Absolute: 0.1 10*3/uL (ref 0.0–0.1)
Basophils Relative: 1.5 % (ref 0.0–3.0)
Eosinophils Absolute: 0.7 10*3/uL (ref 0.0–0.7)
Eosinophils Relative: 9.1 % — ABNORMAL HIGH (ref 0.0–5.0)
HCT: 41.5 % (ref 36.0–46.0)
Hemoglobin: 13.8 g/dL (ref 12.0–15.0)
Lymphocytes Relative: 38.5 % (ref 12.0–46.0)
Lymphs Abs: 2.8 10*3/uL (ref 0.7–4.0)
MCHC: 33.2 g/dL (ref 30.0–36.0)
MCV: 92.4 fl (ref 78.0–100.0)
Monocytes Absolute: 0.6 10*3/uL (ref 0.1–1.0)
Monocytes Relative: 8.3 % (ref 3.0–12.0)
Neutro Abs: 3.1 10*3/uL (ref 1.4–7.7)
Neutrophils Relative %: 42.6 % — ABNORMAL LOW (ref 43.0–77.0)
Platelets: 289 10*3/uL (ref 150.0–400.0)
RBC: 4.49 Mil/uL (ref 3.87–5.11)
RDW: 14 % (ref 11.5–15.5)
WBC: 7.2 10*3/uL (ref 4.0–10.5)

## 2013-11-17 LAB — POCT URINALYSIS DIPSTICK
Bilirubin, UA: NEGATIVE
Glucose, UA: NEGATIVE
Ketones, UA: NEGATIVE
Nitrite, UA: NEGATIVE
Spec Grav, UA: 1.015
Urobilinogen, UA: 0.2
pH, UA: 7.5

## 2013-11-17 LAB — LIPID PANEL
Cholesterol: 188 mg/dL (ref 0–200)
HDL: 96.7 mg/dL (ref >39.00)
LDL Cholesterol: 82 mg/dL (ref 0–99)
NonHDL: 91.3
Total CHOL/HDL Ratio: 2
Triglycerides: 47 mg/dL (ref 0.0–149.0)
VLDL: 9.4 mg/dL (ref 0.0–40.0)

## 2013-11-17 LAB — HEPATIC FUNCTION PANEL
ALT: 19 U/L (ref 0–35)
AST: 22 U/L (ref 0–37)
Albumin: 3.4 g/dL — ABNORMAL LOW (ref 3.5–5.2)
Alkaline Phosphatase: 64 U/L (ref 39–117)
Bilirubin, Direct: 0.1 mg/dL (ref 0.0–0.3)
Total Bilirubin: 0.6 mg/dL (ref 0.2–1.2)
Total Protein: 6.6 g/dL (ref 6.0–8.3)

## 2013-11-17 LAB — BASIC METABOLIC PANEL
BUN: 16 mg/dL (ref 6–23)
CO2: 17 mEq/L — ABNORMAL LOW (ref 19–32)
Calcium: 8.7 mg/dL (ref 8.4–10.5)
Chloride: 106 mEq/L (ref 96–112)
Creatinine, Ser: 0.8 mg/dL (ref 0.4–1.2)
GFR: 81.03 mL/min (ref >60.00)
Glucose, Bld: 86 mg/dL (ref 70–99)
Potassium: 4.4 mEq/L (ref 3.5–5.1)
Sodium: 137 mEq/L (ref 135–145)

## 2013-11-17 LAB — TSH: TSH: 2.26 u[IU]/mL (ref 0.35–4.50)

## 2013-11-17 NOTE — Addendum Note (Signed)
Addended by: Alfred LevinsWYRICK, CINDY D on: 11/17/2013 10:19 AM   Modules accepted: Orders

## 2013-11-19 LAB — URINE CULTURE: Colony Count: 50000

## 2013-11-21 ENCOUNTER — Encounter: Payer: Self-pay | Admitting: *Deleted

## 2013-11-24 ENCOUNTER — Ambulatory Visit (INDEPENDENT_AMBULATORY_CARE_PROVIDER_SITE_OTHER): Payer: BC Managed Care – PPO | Admitting: Family Medicine

## 2013-11-24 ENCOUNTER — Encounter: Payer: Self-pay | Admitting: Family Medicine

## 2013-11-24 VITALS — BP 128/80 | HR 64 | Temp 97.8°F | Ht 65.0 in | Wt 197.0 lb

## 2013-11-24 DIAGNOSIS — Z0000 Encounter for general adult medical examination without abnormal findings: Secondary | ICD-10-CM

## 2013-11-24 DIAGNOSIS — R319 Hematuria, unspecified: Secondary | ICD-10-CM

## 2013-11-24 LAB — POCT URINALYSIS DIPSTICK
Glucose, UA: NEGATIVE
Ketones, UA: NEGATIVE
Nitrite, UA: NEGATIVE
Protein, UA: NEGATIVE
Spec Grav, UA: 1.015
Urobilinogen, UA: 0.2
pH, UA: 7

## 2013-11-24 MED ORDER — CITALOPRAM HYDROBROMIDE 40 MG PO TABS: 40.0000 mg | ORAL_TABLET | Freq: Every day | ORAL | Status: DC

## 2013-11-24 MED ORDER — BUPROPION HCL ER (XL) 300 MG PO TB24: 300.0000 mg | ORAL_TABLET | Freq: Every day | ORAL | Status: DC

## 2013-11-24 NOTE — Progress Notes (Signed)
Subjective:    Patient ID: Amy Davila, female    DOB: 09/19/1955, 58 y.o.   MRN: [id number]  HPI   Patient for complete physical. She sees gynecologist for Pap smears and mammograms. She needs repeat colonoscopy this year and she plans to set that up. She has had some substantial weight gain during this past year. In consistent exercise. Takes Celexa and Wellbutrin for depression and this has been stable. 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  Tetanus up-to-date.  Already had flu vaccine  Past Medical History  Diagnosis Date  . Arthritis   . Depression   . Migraine with aura    Past Surgical History  Procedure Laterality Date  . Knee arthroscopy Left 07/2003  . 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 Ablation  07/2003    HTA  . Bladder suspension  11/29/09    with pelvic hematoma    reports that she has never smoked. She does not have any smokeless tobacco history on file. She reports that she does not drink alcohol or use illicit drugs. family history includes Arthritis in her father and mother; Cancer in her maternal grandmother; Diabetes in her father; Heart disease in her father; Hypertension in her father, maternal grandmother, and mother. Allergies  Allergen Reactions  . Sulfa Antibiotics     Yeast infection occurs      Review of Systems  Constitutional: Negative for fever, activity change, appetite change, fatigue and unexpected weight change.  HENT: Negative for ear pain, hearing loss, sore throat and trouble swallowing.   Eyes: Negative for visual disturbance.  Respiratory: Negative for cough and shortness of breath.   Cardiovascular: Negative for chest pain and palpitations.  Gastrointestinal: Negative for abdominal pain, diarrhea, constipation and blood in stool.  Genitourinary: Negative for dysuria and hematuria.  Musculoskeletal: Positive for arthralgias (Mostly right thumb).  Negative for myalgias and back pain.  Skin: Negative for rash.  Neurological: Negative for dizziness, syncope and headaches.  Hematological: Negative for adenopathy.  Psychiatric/Behavioral: Negative for confusion and dysphoric mood.       Objective:   Physical Exam  Constitutional: She is oriented to person, place, and time. She appears well-developed and well-nourished.  HENT:  Head: Normocephalic and atraumatic.  Eyes: EOM are normal. Pupils are equal, round, and reactive to light.  Neck: Normal range of motion. Neck supple. No thyromegaly present.  Cardiovascular: Normal rate, regular rhythm and normal heart sounds.   No murmur heard. Pulmonary/Chest: Breath sounds normal. No respiratory distress. She has no wheezes. She has no rales.  Abdominal: Soft. Bowel sounds are normal. She exhibits no distension and no mass. There is no tenderness. There is no rebound and no guarding.  Genitourinary:  Per GYN  Musculoskeletal: Normal range of motion. She exhibits no edema.  Lymphadenopathy:    She has no cervical adenopathy.  Neurological: She is alert and oriented to person, place, and time. She displays normal reflexes. No cranial nerve deficit.  Skin: No rash noted.  Psychiatric: She has a normal mood and affect. Her behavior is normal. Judgment and thought content normal.          Assessment & Plan:  Complete physical. Labs reviewed. She has trace blood on urine dipstick. Micro-sent. We discussed importance of more consistent exercise and weight loss. Strategies discussed. Continue GYN follow-up. She will schedule repeat colonoscopy this year. Refilled  Wellbutrin and Celexa for one year

## 2013-11-24 NOTE — Progress Notes (Signed)
Pre visit review using our clinic review tool, if applicable. No additional management support is needed unless otherwise documented below in the visit note. 

## 2014-02-09 ENCOUNTER — Encounter: Payer: Self-pay | Admitting: Nurse Practitioner

## 2014-02-09 ENCOUNTER — Ambulatory Visit (INDEPENDENT_AMBULATORY_CARE_PROVIDER_SITE_OTHER): Payer: BLUE CROSS/BLUE SHIELD | Admitting: Nurse Practitioner

## 2014-02-09 VITALS — BP 124/76 | HR 64 | Ht 65.0 in | Wt 199.0 lb

## 2014-02-09 DIAGNOSIS — Z0000 Encounter for general adult medical examination without abnormal findings: Secondary | ICD-10-CM

## 2014-02-09 DIAGNOSIS — Z1211 Encounter for screening for malignant neoplasm of colon: Secondary | ICD-10-CM

## 2014-02-09 DIAGNOSIS — R8290 Unspecified abnormal findings in urine: Secondary | ICD-10-CM

## 2014-02-09 DIAGNOSIS — Z01419 Encounter for gynecological examination (general) (routine) without abnormal findings: Secondary | ICD-10-CM

## 2014-02-09 LAB — POCT URINALYSIS DIPSTICK
Bilirubin, UA: NEGATIVE
Glucose, UA: NEGATIVE
Ketones, UA: NEGATIVE
Nitrite, UA: NEGATIVE
Urobilinogen, UA: NEGATIVE
pH, UA: 7

## 2014-02-09 NOTE — Patient Instructions (Signed)

## 2014-02-09 NOTE — Progress Notes (Signed)
Patient ID: Amy Davila, female   DOB: 29-Nov-1955, 59 y.o.   MRN: [id number] 59 y.o. G35P2002 Widowed Caucasian Fe here for annual exam.  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  Same partner but not SA often.  Recent [place] cruise.  No vaso symptoms. Still numbness in one area of right buttock.  She now feels this has occurred since weight gain and plans on a weight loss program.  Patient's last menstrual period was 08/20/2008.          Sexually active: Yes.    The current method of family planning is post menopausal status.     Exercising: No.  The patient does not participate in regular exercise at present. Smoker:  no  Health Maintenance: Pap:  02/05/12, negative with neg HR HPV MMG:  03/2010  Colonoscopy:  12/23/07, diverticula, repeat in 5-10 years BMD:   05/11/03, 0.2S/-0.6L TDaP:  02/08/13 Labs:  HB:  PCP  Urine:  Small RBC, 2+ leuk's, trace protein   reports that she has never smoked. She has never used smokeless tobacco. She reports that she drinks about 3.0 - 4.2 oz of alcohol per week. She reports that she does not use illicit drugs.  Past Medical History  Diagnosis Date  . Arthritis   . Depression   . Migraine with aura     Past Surgical History  Procedure Laterality Date  . Knee arthroscopy Left 07/2003  . 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 Ablation  07/2003    HTA  . Bladder suspension  11/29/09    with pelvic hematoma    Current Outpatient Prescriptions  Medication Sig Dispense Refill  . buPROPion (WELLBUTRIN XL) 300 MG 24 hr tablet Take 1 tablet (300 mg total) by mouth daily. 90 tablet 3  . calcium-vitamin D (OSCAL WITH D) 500-200 MG-UNIT per tablet Take 1 tablet by mouth daily.      . citalopram (CELEXA) 40 MG tablet Take 1 tablet (40 mg total) by mouth daily. 90 tablet 3   No current facility-administered medications for this visit.    Family History  Problem Relation Age of Onset  . Arthritis Mother   . Hypertension Mother    . Arthritis Father   . Hypertension Father   . Diabetes Father     type ll  . Heart disease Father     a fib  . Cancer Maternal Grandmother     lung  . Hypertension Maternal Grandmother     ROS:  Pertinent items are noted in HPI.  Otherwise, a comprehensive ROS was negative.  Exam:   BP 124/76 mmHg  Pulse 64  Ht  (1.651 m)  Wt 199 lb (90.266 kg)  BMI 33.12 kg/m2  LMP 08/20/2008 Height:  (165.1 cm) Ht Readings from Last 3 Encounters:  02/09/14  (1.651 m)  11/24/13  (1.651 m)  02/21/13  (1.651 m)    General appearance: alert, cooperative and appears stated age Head: Normocephalic, without obvious abnormality, atraumatic Neck: no adenopathy, supple, symmetrical, trachea midline and thyroid normal to inspection and palpation Lungs: clear to auscultation bilaterally Breasts: normal appearance, no masses or tenderness Heart: regular rate and rhythm Abdomen: soft, non-tender; no masses,  no organomegaly Extremities: extremities normal, atraumatic, no cyanosis or edema Skin: Skin color, texture, turgor normal. No rashes or lesions Lymph nodes: Cervical, supraclavicular, and axillary nodes normal. No abnormal inguinal nodes palpated Neurologic: Grossly normal  Pelvic: External genitalia:  no lesions              Urethra:  normal appearing urethra with no masses, tenderness or lesions              Bartholin's and Skene's: normal                 Vagina: normal appearing vagina with normal color and discharge, no lesions              Cervix: anteverted              Pap taken: No. Bimanual Exam:  Uterus:  normal size, contour, position, consistency, mobility, non-tender              Adnexa: no mass, fullness, tenderness               Rectovaginal: Confirms               Anus:  normal sphincter tone, no lesions  Chaperone present:  no  A:  Well Woman with normal exam  Postmenopausal  R/O  UTI Pain/ numbness right buttock ?  Etiology/ most likely nerve related   P:   Reviewed health and wellness pertinent to exam  Pap smear not taken today  Mammogram is past due and will schedule  Colonoscopy is past due and will schedule  Counseled on breast self exam, mammography screening, adequate intake of calcium and vitamin D, diet and exercise, Kegel's exercises return annually or prn  An After Visit Summary was printed and given to the patient.

## 2014-02-10 LAB — URINALYSIS, MICROSCOPIC ONLY
Casts: NONE SEEN
Crystals: NONE SEEN

## 2014-02-10 LAB — URINE CULTURE
Colony Count: NO GROWTH
Organism ID, Bacteria: NO GROWTH

## 2014-02-12 NOTE — Progress Notes (Signed)
Reviewed personally.  M. Suzanne Ronnisha Felber, MD.  

## 2014-02-28 ENCOUNTER — Other Ambulatory Visit: Payer: Self-pay | Admitting: Nurse Practitioner

## 2014-02-28 DIAGNOSIS — Z1231 Encounter for screening mammogram for malignant neoplasm of breast: Secondary | ICD-10-CM

## 2014-03-10 ENCOUNTER — Ambulatory Visit
Admission: RE | Admit: 2014-03-10 | Discharge: 2014-03-10 | Disposition: A | Discharge status: Home / Self Care | Payer: BLUE CROSS/BLUE SHIELD | Source: Ambulatory Visit | Attending: Nurse Practitioner | Admitting: Nurse Practitioner

## 2014-03-10 DIAGNOSIS — Z1231 Encounter for screening mammogram for malignant neoplasm of breast: Secondary | ICD-10-CM

## 2014-07-03 ENCOUNTER — Ambulatory Visit (INDEPENDENT_AMBULATORY_CARE_PROVIDER_SITE_OTHER): Payer: BLUE CROSS/BLUE SHIELD | Admitting: Family Medicine

## 2014-07-03 ENCOUNTER — Encounter: Payer: Self-pay | Admitting: Family Medicine

## 2014-07-03 VITALS — BP 120/80 | HR 78 | Temp 99.2°F | Wt 185.0 lb

## 2014-07-03 DIAGNOSIS — J209 Acute bronchitis, unspecified: Secondary | ICD-10-CM | POA: Diagnosis not present

## 2014-07-03 MED ORDER — BENZONATATE 200 MG PO CAPS: 200.0000 mg | ORAL_CAPSULE | Freq: Three times a day (TID) | ORAL | Status: DC | PRN

## 2014-07-03 MED ORDER — AZITHROMYCIN 250 MG PO TABS: ORAL_TABLET | ORAL | Status: AC

## 2014-07-03 NOTE — Progress Notes (Signed)
Pre visit review using our clinic review tool, if applicable. No additional management support is needed unless otherwise documented below in the visit note. 

## 2014-07-03 NOTE — Patient Instructions (Signed)

## 2014-07-03 NOTE — Progress Notes (Signed)
   Subjective:    Patient ID: Amy Davila, female    DOB: November 28, 1955, 59 y.o.   MRN: [id number]  HPI Acute visit for cough. Onset a week ago. Productive of green sputum. She's tried over-the-counter Mucinex without improvement. Possible low-grade fever. No obvious wheezing or dyspnea. Nonsmoker. No history of asthma. Minimal nasal congestive symptoms   Review of Systems  Constitutional: Negative for fever and chills.  HENT: Positive for congestion.   Respiratory: Positive for cough.        Objective:   Physical Exam  Constitutional: She appears well-developed and well-nourished.  HENT:  Right Ear: External ear normal.  Left Ear: External ear normal.  Mouth/Throat: Oropharynx is clear and moist.  Neck: Neck supple. No thyromegaly present.  Cardiovascular: Normal rate and regular rhythm.   Pulmonary/Chest: Effort normal and breath sounds normal. No respiratory distress. She has no wheezes. She has no rales.          Assessment & Plan:  Acute bronchitis. Tessalon Perles 200 mg every 8 hours as needed for cough. Consider course of Zithromax if symptoms persist or worsen

## 2014-12-27 ENCOUNTER — Other Ambulatory Visit (INDEPENDENT_AMBULATORY_CARE_PROVIDER_SITE_OTHER): Payer: BLUE CROSS/BLUE SHIELD

## 2014-12-27 DIAGNOSIS — Z0000 Encounter for general adult medical examination without abnormal findings: Secondary | ICD-10-CM | POA: Diagnosis not present

## 2014-12-27 LAB — CBC WITH DIFFERENTIAL/PLATELET
Basophils Absolute: 0.1 10*3/uL (ref 0.0–0.1)
Basophils Relative: 1.1 % (ref 0.0–3.0)
Eosinophils Absolute: 0.3 10*3/uL (ref 0.0–0.7)
Eosinophils Relative: 5.2 % — ABNORMAL HIGH (ref 0.0–5.0)
HCT: 42 % (ref 36.0–46.0)
Hemoglobin: 14.1 g/dL (ref 12.0–15.0)
Lymphocytes Relative: 34.3 % (ref 12.0–46.0)
Lymphs Abs: 2.1 10*3/uL (ref 0.7–4.0)
MCHC: 33.6 g/dL (ref 30.0–36.0)
MCV: 92.3 fl (ref 78.0–100.0)
Monocytes Absolute: 0.6 10*3/uL (ref 0.1–1.0)
Monocytes Relative: 9.6 % (ref 3.0–12.0)
Neutro Abs: 3 10*3/uL (ref 1.4–7.7)
Neutrophils Relative %: 49.8 % (ref 43.0–77.0)
Platelets: 309 10*3/uL (ref 150.0–400.0)
RBC: 4.55 Mil/uL (ref 3.87–5.11)
RDW: 13.6 % (ref 11.5–15.5)
WBC: 6.1 10*3/uL (ref 4.0–10.5)

## 2014-12-27 LAB — LIPID PANEL
Cholesterol: 174 mg/dL (ref 0–200)
HDL: 85.3 mg/dL (ref >39.00)
LDL Cholesterol: 77 mg/dL (ref 0–99)
NonHDL: 89.03
Total CHOL/HDL Ratio: 2
Triglycerides: 59 mg/dL (ref 0.0–149.0)
VLDL: 11.8 mg/dL (ref 0.0–40.0)

## 2014-12-27 LAB — HEPATIC FUNCTION PANEL
ALT: 14 U/L (ref 0–35)
AST: 16 U/L (ref 0–37)
Albumin: 3.9 g/dL (ref 3.5–5.2)
Alkaline Phosphatase: 67 U/L (ref 39–117)
Bilirubin, Direct: 0.1 mg/dL (ref 0.0–0.3)
Total Bilirubin: 0.5 mg/dL (ref 0.2–1.2)
Total Protein: 6.4 g/dL (ref 6.0–8.3)

## 2014-12-27 LAB — BASIC METABOLIC PANEL
BUN: 13 mg/dL (ref 6–23)
CO2: 26 mEq/L (ref 19–32)
Calcium: 8.9 mg/dL (ref 8.4–10.5)
Chloride: 101 mEq/L (ref 96–112)
Creatinine, Ser: 0.68 mg/dL (ref 0.40–1.20)
GFR: 94.56 mL/min (ref >60.00)
Glucose, Bld: 81 mg/dL (ref 70–99)
Potassium: 4.4 mEq/L (ref 3.5–5.1)
Sodium: 135 mEq/L (ref 135–145)

## 2014-12-27 LAB — TSH: TSH: 2.22 u[IU]/mL (ref 0.35–4.50)

## 2015-01-03 ENCOUNTER — Ambulatory Visit (INDEPENDENT_AMBULATORY_CARE_PROVIDER_SITE_OTHER): Payer: BLUE CROSS/BLUE SHIELD | Admitting: Family Medicine

## 2015-01-03 ENCOUNTER — Encounter: Payer: Self-pay | Admitting: Family Medicine

## 2015-01-03 VITALS — BP 100/80 | HR 78 | Temp 97.8°F | Resp 16 | Ht 65.0 in | Wt 196.3 lb

## 2015-01-03 DIAGNOSIS — Z0000 Encounter for general adult medical examination without abnormal findings: Secondary | ICD-10-CM

## 2015-01-03 DIAGNOSIS — Z23 Encounter for immunization: Secondary | ICD-10-CM

## 2015-01-03 MED ORDER — BUPROPION HCL ER (XL) 300 MG PO TB24: 300.0000 mg | ORAL_TABLET | Freq: Every day | ORAL | Status: DC

## 2015-01-03 MED ORDER — CITALOPRAM HYDROBROMIDE 40 MG PO TABS: 40.0000 mg | ORAL_TABLET | Freq: Every day | ORAL | Status: DC

## 2015-01-03 NOTE — Progress Notes (Signed)
   Subjective:    Patient ID: Amy Davila, female    DOB: 01/31/1955, 59 y.o.   MRN: [id number]  HPI Patient here for physical exam. She continues to see gynecologist yearly. History of depression currently stable on Celexa and Wellbutrin. Requesting refills. Attending graduate [employer]. Never used tobacco. Recently started exercise program Rare alcohol use. Weight unchanged over the past year Had repeat colonoscopy last spring reportedly normal  Past Medical History  Diagnosis Date  . Arthritis   . Depression   . Migraine with aura    Past Surgical History  Procedure Laterality Date  . Knee arthroscopy Left 07/2003  . 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 Ablation  07/2003    HTA  . Bladder suspension  11/29/09    with pelvic hematoma    reports that she has never smoked. She has never used smokeless tobacco. She reports that she drinks about 3.0 - 4.2 oz of alcohol per week. She reports that she does not use illicit drugs. family history includes Arthritis in her father and mother; Cancer in her maternal grandmother; Diabetes in her father; Heart disease in her father; Hypertension in her father, maternal grandmother, and mother. Allergies  Allergen Reactions  . Sulfa Antibiotics     Yeast infection occurs      Review of Systems  Constitutional: Negative for fever, activity change, appetite change, fatigue and unexpected weight change.  HENT: Negative for ear pain, hearing loss, sore throat and trouble swallowing.   Eyes: Negative for visual disturbance.  Respiratory: Negative for cough and shortness of breath.   Cardiovascular: Negative for chest pain and palpitations.  Gastrointestinal: Negative for abdominal pain, diarrhea, constipation and blood in stool.  Genitourinary: Negative for dysuria and hematuria.  Musculoskeletal: Negative for myalgias, back pain and arthralgias.  Skin: Negative for rash.  Neurological: Negative for dizziness, syncope and  headaches.  Hematological: Negative for adenopathy.  Psychiatric/Behavioral: Negative for confusion and dysphoric mood.       Objective:   Physical Exam  Constitutional: She is oriented to person, place, and time. She appears well-developed and well-nourished.  HENT:  Head: Normocephalic and atraumatic.  Eyes: EOM are normal. Pupils are equal, round, and reactive to light.  Neck: Normal range of motion. Neck supple. No thyromegaly present.  Cardiovascular: Normal rate, regular rhythm and normal heart sounds.   No murmur heard. Pulmonary/Chest: Breath sounds normal. No respiratory distress. She has no wheezes. She has no rales.  Abdominal: Soft. Bowel sounds are normal. She exhibits no distension and no mass. There is no tenderness. There is no rebound and no guarding.  Genitourinary:  Per GYN  Musculoskeletal: Normal range of motion. She exhibits no edema.  Lymphadenopathy:    She has no cervical adenopathy.  Neurological: She is alert and oriented to person, place, and time. She displays normal reflexes. No cranial nerve deficit.  Skin: No rash noted.  Psychiatric: She has a normal mood and affect. Her behavior is normal. Judgment and thought content normal.          Assessment & Plan:  Physical exam. Labs reviewed. No major concerns. She is encouraged to lose some weight. Flu vaccine given. Other immunizations up-to-date

## 2015-01-04 ENCOUNTER — Ambulatory Visit (INDEPENDENT_AMBULATORY_CARE_PROVIDER_SITE_OTHER): Payer: BLUE CROSS/BLUE SHIELD | Admitting: Podiatry

## 2015-01-04 ENCOUNTER — Encounter: Payer: Self-pay | Admitting: Podiatry

## 2015-01-04 VITALS — BP 126/81 | HR 70 | Ht 65.0 in | Wt 195.0 lb

## 2015-01-04 DIAGNOSIS — M79605 Pain in left leg: Secondary | ICD-10-CM

## 2015-01-04 DIAGNOSIS — M659 Synovitis and tenosynovitis, unspecified: Secondary | ICD-10-CM | POA: Insufficient documentation

## 2015-01-04 DIAGNOSIS — M79606 Pain in leg, unspecified: Secondary | ICD-10-CM | POA: Insufficient documentation

## 2015-01-04 DIAGNOSIS — M65842 Other synovitis and tenosynovitis, left hand: Secondary | ICD-10-CM | POA: Diagnosis not present

## 2015-01-04 NOTE — Progress Notes (Signed)
Subjective: 59 year old female presents complaining of pain in left foot near instep area for duration of 6 months. Pain is on going day and night.  She also noticed she was getting more pain as she gained weight. Positive history of sprained left ankle ankle several times for the past 8 years. She is able to walk without limping.   Review of Systems - Negative except allergic to Sulfa.   Objective: Dermatologic: Normal skin without abnormal lesions. Vascular: All pedal pulses are palpable. No acute edema or erythema noted. Neurologic: All epicritic and tactile sensations grossly intact bilateral. Orthopedic: Cavus type foot, normal ankle joint range of motion.  No pain was elicited through range of motion on rearfoot or forefoot.  Pain upon palpation and light pressure along the Tibialis posterior attachment site of Naviculocuneiform complex medially.  Positive of mild soft tissue enlargement of distal end of Posterior tibial tendon noted.  Assessment: Tenosynovitis at the attachment site, dorsomedal aspect of Naviculocuneiform joint, of Posterior Tibial tendon left foot.  Weakened Posterior tibial tendon from chronic ankle sprain.  Plan: Reviewed clinical findings and available treatment options, well supported lace up shoes, orthotics, ankle brace, metatarsal binder, NSAIA, and modification of activities. Metatarsal binder dispensed to provide added support at instep of left foot. Patient is to use ankle brace that is available at home.  May benefit from custom/OTC orthotics.  Patient will return for orthotics.

## 2015-01-04 NOTE — Patient Instructions (Signed)
Seen for pain in left foot.  Possible injury Tibialis posterior tendon attachment site left foot, and affecting intrinsic structure. May benefit from custom orthotics. Metatarsal binder (small) dispensed x 1. Will contact insurance benefit for orthotics.

## 2015-02-13 ENCOUNTER — Ambulatory Visit (INDEPENDENT_AMBULATORY_CARE_PROVIDER_SITE_OTHER): Payer: BLUE CROSS/BLUE SHIELD | Admitting: Nurse Practitioner

## 2015-02-13 ENCOUNTER — Encounter: Payer: Self-pay | Admitting: Nurse Practitioner

## 2015-02-13 VITALS — BP 116/72 | HR 72 | Ht 65.0 in | Wt 192.0 lb

## 2015-02-13 DIAGNOSIS — Z0000 Encounter for general adult medical examination without abnormal findings: Secondary | ICD-10-CM | POA: Diagnosis not present

## 2015-02-13 DIAGNOSIS — G43119 Migraine with aura, intractable, without status migrainosus: Secondary | ICD-10-CM | POA: Diagnosis not present

## 2015-02-13 DIAGNOSIS — Z01419 Encounter for gynecological examination (general) (routine) without abnormal findings: Secondary | ICD-10-CM | POA: Diagnosis not present

## 2015-02-13 LAB — POCT URINALYSIS DIPSTICK
Bilirubin, UA: NEGATIVE
Blood, UA: NEGATIVE
Glucose, UA: NEGATIVE
Ketones, UA: NEGATIVE
Nitrite, UA: NEGATIVE
Protein, UA: NEGATIVE
Urobilinogen, UA: NEGATIVE
pH, UA: 8

## 2015-02-13 NOTE — Progress Notes (Signed)
Patient ID: Amy Davila, female   DOB: 08/09/1955, 60 y.o.   MRN: [id number]  60 y.o. G69P2002 Widowed  Caucasian Fe here for annual exam.  No new health problems.  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  She is now doing internship and officially will not graduate until next May.  She has an opportunity for externship with [facility] and will be doing therapy with them.  This is a very high honor for her and she is very excited -even if she does not stay with Cancer Therapy.    Still working on weight loss - and since December has lost 8 lbs.  Still dating same partner but not very SA.  No concerns about STD's.  Patient's last menstrual period was 08/20/2008 (approximate).          Sexually active: Yes.    The current method of family planning is post menopausal status.  Same partner. Exercising: Yes.    Home exercise routine includes walking. Smoker:  no  Health Maintenance: Pap: 02/05/12, WNL, neg HR HPV MMG: 03/10/14, Bi-Rads 1: negative  Colonoscopy: 2016, Dr. Loreta Ave normal repeat in 10 yrs BMD: age 35? TDaP: 02/08/13 Shingles: Not indicated due to age Pneumonia: Not indicated due to age Hep C and HIV: discussed today Labs: PCP in [facility] 12/2014  Urine: 1+ leuks asymptomatic   reports that she has never smoked. She has never used smokeless tobacco. She reports that she drinks about 3.0 - 4.2 oz of alcohol per week. She reports that she does not use illicit drugs.  Past Medical History  Diagnosis Date  . Arthritis   . Depression   . Migraine with aura     Past Surgical History  Procedure Laterality Date  . Knee arthroscopy Left 07/2003  . 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 Ablation  07/2003    HTA  . Bladder suspension  11/29/09    with pelvic hematoma    Current Outpatient Prescriptions  Medication Sig Dispense Refill  . buPROPion (WELLBUTRIN XL) 300 MG 24 hr tablet Take 1 tablet (300 mg total) by mouth daily. 90 tablet 3  .  calcium-vitamin D (OSCAL WITH D) 500-200 MG-UNIT per tablet Take 1 tablet by mouth daily.      . citalopram (CELEXA) 40 MG tablet Take 1 tablet (40 mg total) by mouth daily. 90 tablet 3   No current facility-administered medications for this visit.    Family History  Problem Relation Age of Onset  . Arthritis Mother   . Hypertension Mother   . Arthritis Father   . Hypertension Father   . Diabetes Father     type ll  . Heart disease Father     a fib  . Cancer Maternal Grandmother     lung  . Hypertension Maternal Grandmother     ROS:  Pertinent items are noted in HPI.  Otherwise, a comprehensive ROS was negative.  Exam:   BP 116/72 mmHg  Pulse 72  Ht  (1.651 m)  Wt 192 lb (87.091 kg)  BMI 31.95 kg/m2  LMP 08/20/2008 (Approximate) Height:  (165.1 cm) Ht Readings from Last 3 Encounters:  02/13/15  (1.651 m)  01/04/15  (1.651 m)  01/03/15  (1.651 m)    General appearance: alert, cooperative and appears stated age Head: Normocephalic, without obvious abnormality, atraumatic Neck: no adenopathy, supple, symmetrical, trachea midline and thyroid normal to inspection  and palpation Lungs: clear to auscultation bilaterally Breasts: normal appearance, no masses or tenderness Heart: regular rate and rhythm Abdomen: soft, non-tender; no masses,  no organomegaly Extremities: extremities normal, atraumatic, no cyanosis or edema Skin: Skin color, texture, turgor normal. No rashes or lesions Lymph nodes: Cervical, supraclavicular, and axillary nodes normal. No abnormal inguinal nodes palpated Neurologic: Grossly normal   Pelvic: External genitalia:  no lesions              Urethra:  normal appearing urethra with no masses, tenderness or lesions              Bartholin's and Skene's: normal                 Vagina: normal appearing vagina with normal color and discharge, no lesions              Cervix: anteverted              Pap taken: Yes.   Bimanual  Exam:  Uterus:  normal size, contour, position, consistency, mobility, non-tender              Adnexa: no mass, fullness, tenderness               Rectovaginal: Confirms               Anus:  normal sphincter tone, no lesions  Chaperone present: yes  A:  Well Woman with normal exam  Postmenopausal - no HRT  Situational anxiety and depression - doing well on Med's  Generalized arthritis   P:   Reviewed health and wellness pertinent to exam  Pap smear as above  Mammogram is due 02/2015  Follow with pap and labs  Counseled on breast self exam, mammography screening, adequate intake of calcium and vitamin D, diet and exercise return annually or prn  An After Visit Summary was printed and given to the patient.

## 2015-02-13 NOTE — Patient Instructions (Signed)

## 2015-02-14 ENCOUNTER — Other Ambulatory Visit: Payer: Self-pay

## 2015-02-14 DIAGNOSIS — Z1231 Encounter for screening mammogram for malignant neoplasm of breast: Secondary | ICD-10-CM

## 2015-02-14 LAB — HEPATITIS C ANTIBODY: HCV Ab: NEGATIVE

## 2015-02-15 ENCOUNTER — Ambulatory Visit: Payer: BLUE CROSS/BLUE SHIELD | Admitting: Nurse Practitioner

## 2015-02-16 LAB — IPS PAP TEST WITH HPV

## 2015-02-18 NOTE — Progress Notes (Signed)
Encounter reviewed by Dr. Brook Amundson C. Silva.  

## 2015-02-20 ENCOUNTER — Telehealth: Payer: Self-pay | Admitting: Emergency Medicine

## 2015-02-20 DIAGNOSIS — R87611 Atypical squamous cells cannot exclude high grade squamous intraepithelial lesion on cytologic smear of cervix (ASC-H): Secondary | ICD-10-CM

## 2015-02-20 DIAGNOSIS — R87810 Cervical high risk human papillomavirus (HPV) DNA test positive: Secondary | ICD-10-CM

## 2015-02-20 NOTE — Telephone Encounter (Signed)
Atypical squamous cells of undetermined significance, a high-grade squamous intraepithelial lesion cannot be excluded (ASC-H).     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

## 2015-02-20 NOTE — Telephone Encounter (Signed)
Patient notified of message from Ria Comment, FNP.   She is agreeable to scheduling colposcopy. Scheduled for colposcopy with Dr. Hyacinth Meeker 02/23/15 at 1000.  Brief description of procedure given to patient.  Colposcopy pre-procedure instructions given.  Make sure to eat a meal and hydrate before appointment.  Advised 800 mg of Motrin PO with food one hour prior to appointment.    Patient verbalized understanding of PAP results and HPV results and pre-procedure instructions.    Patient is advised she will be contacted with insurance coverage information. cc Harland Dingwall.  Routing to provider for final review. Patient agreeable to disposition. Will close encounter.   cc Ria Comment, FNP

## 2015-02-20 NOTE — Telephone Encounter (Signed)
-----   Message from Ria Comment, FNP sent at 02/18/2015  7:11 PM EST ----- Please call pt and let her know about abnormal pap and needs colpo biopsy for ASCUS - H and + HR HPV.  She has a hx of CIN II 1981

## 2015-02-23 ENCOUNTER — Encounter: Payer: Self-pay | Admitting: Obstetrics & Gynecology

## 2015-02-23 ENCOUNTER — Ambulatory Visit (INDEPENDENT_AMBULATORY_CARE_PROVIDER_SITE_OTHER): Payer: BLUE CROSS/BLUE SHIELD | Admitting: Obstetrics & Gynecology

## 2015-02-23 VITALS — BP 104/60 | HR 64 | Resp 16 | Ht 65.0 in | Wt 193.0 lb

## 2015-02-23 DIAGNOSIS — IMO0002 Reserved for concepts with insufficient information to code with codable children: Secondary | ICD-10-CM

## 2015-02-23 DIAGNOSIS — R896 Abnormal cytological findings in specimens from other organs, systems and tissues: Secondary | ICD-10-CM | POA: Diagnosis not present

## 2015-02-23 NOTE — Patient Instructions (Signed)

## 2015-02-23 NOTE — Progress Notes (Signed)
60 y.o. G71P2 Widowed White female here for colposcopy with possible biopsies and/or ECC due to ASCUS-H Pap with +HR HPV obtained 02/13/15 with Shirlyn Goltz.  Pt has a remote hx of CIN2 in 1981 that was treated.  She had resolution of Pap smears until this one.  Pt did have HR HPV testing 2014 that was negative.  All questions about her Pap smear and HPV findings were discussed before proceeding with colposcopy.  Patient's last menstrual period was 08/20/2008 (approximate).          Sexually active: Yes.    The current method of family planning is post menopausal status.     Patient has been counseled about results and procedure.  Risks and benefits have bene reviewed including immediate and/or delayed bleeding, infection, cervical scaring from procedure, possibility of needing additional follow up as well as treatment.  rare risks of missing a lesion discussed as well.  All questions answered.  Pt ready to proceed.  BP 104/60 mmHg  Pulse 64  Resp 16  Ht  (1.651 m)  Wt 193 lb (87.544 kg)  BMI 32.12 kg/m2  LMP 08/20/2008 (Approximate)  General appearance: alert, cooperative and appears stated age Head: Normocephalic, without obvious abnormality, atraumatic Neurologic: Grossly normal  Pelvic: External genitalia:  no lesions              Urethra:  normal appearing urethra with no masses, tenderness or lesions              Bartholins and Skenes: normal                 Vagina: normal appearing vagina with normal color and discharge, no lesions              Cervix: no lesions              Pap taken: No.  Speculum placed.  3% acetic acid applied to cervix for >45 seconds.  Cervix visualized with both 7.5X and 15X magnification.  No AWE noted  Green filter also used.  Lugols solution was used.  Findings:  Small area of decreased staining with Lugol's solution at 4 o'clock.  Biopsy:  4 o'clock.  ECC:  was performed.  Monsel's was needed.  Excellent hemostasis was present.  Pt tolerated procedure  well and all instruments were removed.  Findings noted above on picture of cervix.  Assessment:  ASCUS-H, + HR HPV testing, remote hx of CIN2 in 1981  Plan:  Pathology results will be called to patient and follow-up planned pending results.

## 2015-02-27 LAB — IPS OTHER TISSUE BIOPSY

## 2015-03-02 ENCOUNTER — Telehealth: Payer: Self-pay | Admitting: Emergency Medicine

## 2015-03-02 NOTE — Telephone Encounter (Signed)
-----   Message from Jerene Bears, MD sent at 03/01/2015  5:36 PM EST ----- Please inform pt that her biopsy showed HPV only.  ECC was negative.  Repeat Pap smear 6 months.  I'd like her to use some vaginal coconut oil about a month before she comes.  Dime-sized amount of solid coconut oil vaginally twice weekly for four weeks before Pap.  06 recall.

## 2015-03-02 NOTE — Telephone Encounter (Signed)
Call to patient and she is given message from Dr. Hyacinth Meeker. Verbalizes understanding of results. She is given instructions for follow up pap smear and scheduled pap smear for 08/31/15 at 0830 with Dr. Hyacinth Meeker 06 recall entered.

## 2015-03-14 ENCOUNTER — Ambulatory Visit
Admission: RE | Admit: 2015-03-14 | Discharge: 2015-03-14 | Disposition: A | Discharge status: Home / Self Care | Payer: BLUE CROSS/BLUE SHIELD | Source: Ambulatory Visit

## 2015-03-14 DIAGNOSIS — Z1231 Encounter for screening mammogram for malignant neoplasm of breast: Secondary | ICD-10-CM

## 2015-04-06 ENCOUNTER — Telehealth: Payer: Self-pay | Admitting: Family Medicine

## 2015-04-06 DIAGNOSIS — Z0184 Encounter for antibody response examination: Secondary | ICD-10-CM

## 2015-04-06 NOTE — Telephone Encounter (Signed)
Pt want a blood dr for measles/mumps/rubella /Chickpox/pollo/pertussis

## 2015-04-09 NOTE — Telephone Encounter (Signed)
Pt was seen on 01/05/2016 for CPE. Okay to order titers?

## 2015-04-09 NOTE — Telephone Encounter (Signed)
yes

## 2015-04-10 ENCOUNTER — Ambulatory Visit (INDEPENDENT_AMBULATORY_CARE_PROVIDER_SITE_OTHER): Payer: BLUE CROSS/BLUE SHIELD | Admitting: Family Medicine

## 2015-04-10 ENCOUNTER — Other Ambulatory Visit (INDEPENDENT_AMBULATORY_CARE_PROVIDER_SITE_OTHER): Payer: BLUE CROSS/BLUE SHIELD

## 2015-04-10 DIAGNOSIS — Z111 Encounter for screening for respiratory tuberculosis: Secondary | ICD-10-CM

## 2015-04-10 DIAGNOSIS — D819 Combined immunodeficiency, unspecified: Secondary | ICD-10-CM

## 2015-04-10 NOTE — Telephone Encounter (Signed)
Orders entered

## 2015-04-11 LAB — MEASLES/MUMPS/RUBELLA IMMUNITY
Mumps IgG: >300 AU/mL — ABNORMAL HIGH (ref <9.00)
Rubella: 5.12 Index — ABNORMAL HIGH (ref <0.90)
Rubeola IgG: 17.1 AU/mL (ref <25.00)

## 2015-04-11 LAB — VARICELLA ZOSTER ANTIBODY, IGG: Varicella IgG: 3535 Index — ABNORMAL HIGH (ref <135.00)

## 2015-04-12 DIAGNOSIS — Z111 Encounter for screening for respiratory tuberculosis: Secondary | ICD-10-CM

## 2015-04-13 LAB — BORDETELLA PERTUSSIS AB IGG,IGA
FHA IgA: 11 IU/mL
FHA IgG: 31 IU/mL
PT IgA: 1 IU/mL
PT IgG: 8 IU/mL

## 2015-04-17 LAB — POLIOVIRUS (1,3) ABS, NEUTRALIZ.
Polio 1 Titer: >1:128 {titer}
Polio 3 Titer: 1:64 {titer}

## 2015-04-24 ENCOUNTER — Ambulatory Visit (INDEPENDENT_AMBULATORY_CARE_PROVIDER_SITE_OTHER): Payer: BLUE CROSS/BLUE SHIELD | Admitting: Family Medicine

## 2015-04-24 DIAGNOSIS — Z23 Encounter for immunization: Secondary | ICD-10-CM | POA: Diagnosis not present

## 2015-08-31 ENCOUNTER — Ambulatory Visit: Payer: BLUE CROSS/BLUE SHIELD | Admitting: Obstetrics & Gynecology

## 2015-09-07 ENCOUNTER — Ambulatory Visit (INDEPENDENT_AMBULATORY_CARE_PROVIDER_SITE_OTHER): Payer: BLUE CROSS/BLUE SHIELD | Admitting: Obstetrics & Gynecology

## 2015-09-07 ENCOUNTER — Encounter: Payer: Self-pay | Admitting: Obstetrics & Gynecology

## 2015-09-07 VITALS — BP 118/70 | HR 64 | Resp 16 | Wt 194.0 lb

## 2015-09-07 DIAGNOSIS — IMO0002 Reserved for concepts with insufficient information to code with codable children: Secondary | ICD-10-CM

## 2015-09-07 DIAGNOSIS — R8290 Unspecified abnormal findings in urine: Secondary | ICD-10-CM | POA: Diagnosis not present

## 2015-09-07 DIAGNOSIS — R896 Abnormal cytological findings in specimens from other organs, systems and tissues: Secondary | ICD-10-CM

## 2015-09-07 DIAGNOSIS — R300 Dysuria: Secondary | ICD-10-CM | POA: Diagnosis not present

## 2015-09-07 LAB — POCT URINALYSIS DIPSTICK
Bilirubin, UA: NEGATIVE
Glucose, UA: NEGATIVE
Ketones, UA: NEGATIVE
Nitrite, UA: NEGATIVE
Protein, UA: NEGATIVE
Urobilinogen, UA: NEGATIVE
pH, UA: 6

## 2015-09-07 MED ORDER — NITROFURANTOIN MONOHYD MACRO 100 MG PO CAPS: 100.0000 mg | ORAL_CAPSULE | Freq: Two times a day (BID) | ORAL | 0 refills | Status: DC

## 2015-09-08 LAB — URINALYSIS, MICROSCOPIC ONLY
Casts: NONE SEEN [LPF]
Crystals: NONE SEEN [HPF]
Yeast: NONE SEEN [HPF]

## 2015-09-09 LAB — URINE CULTURE: Organism ID, Bacteria: 10000

## 2015-09-11 LAB — IPS PAP SMEAR ONLY

## 2015-09-12 ENCOUNTER — Telehealth: Payer: Self-pay | Admitting: *Deleted

## 2015-09-12 DIAGNOSIS — R87611 Atypical squamous cells cannot exclude high grade squamous intraepithelial lesion on cytologic smear of cervix (ASC-H): Secondary | ICD-10-CM

## 2015-09-12 NOTE — Telephone Encounter (Signed)
-----   Message from Jerene BearsMary S Miller, MD sent at 09/12/2015  7:53 AM EDT ----- Please let pt know her pap is still ASCUS H.  Needs repeat colposcopy.  Also let her know urine culture was negative.  I do want her to finish all antibiotics and I will repeat her urine tests at her colposcopy.

## 2015-09-12 NOTE — Telephone Encounter (Signed)
-----   Message from Mary S Miller, MD sent at 09/12/2015  7:53 AM EDT ----- Please let pt know her pap is still ASCUS H.  Needs repeat colposcopy.  Also let her know urine culture was negative.  I do want her to finish all antibiotics and I will repeat her urine tests at her colposcopy. 

## 2015-09-12 NOTE — Telephone Encounter (Signed)
Return call from patient. Advised Pap smear with abnormal cells and Amy Davila recommenHyacinth Meekerds repeat colpo. Brief review of procedure provided and colpo scheduled for 09-26-16. Patient instructed to take motrin 800 mg one hour prior with food.  Patient advised of negative urine culture. States she was only able to take two doses of medication before discontinued due to reaction. States hands and feet were red and itching and burning like fire. Happened significantly enough to know it was not a coincidence and she stopped medication. She does not have any UTI symptoms. Advised will update Amy Amy Davila and will call back if any additional instructions.   Do you want Macrobid listed as allergy?

## 2015-09-12 NOTE — Telephone Encounter (Signed)
Call to patient. Left message to call back to [place] or [place].

## 2015-09-12 NOTE — Progress Notes (Signed)
60 y.o. 572P2002 Widowed CaucasianF here for repeat Pap smear due to history of ASCUS-H pap smear.  HR HPV was present as well.  This was obtained 02/13/15.  colposcop was performed 02/23/15 showing HPV findings.  Biopsy did not show dysplasia and ECC was negative.  Due to the ASCUS H finding, I recommended repeat Pap smear today.  Guidelines reviewed.  Pt knows this pap is "early" by guidelines but that I wanted to be more conservative with her.   Pt reports she has complaint of dysuria for the past two days.  She denies blood in urine or back pain.  Denies fever.  Denies vaginal bleeding or vaginal discharge.   Pt is PMP.  Review of Systems  All other systems reviewed and are negative.   EXAM: BP 118/70 (BP Location: Right Arm, Patient Position: Sitting, Cuff Size: Normal)   Pulse 64   Resp 16   Ht (P) 5\' 5"  (1.651 m)   Wt 194 lb (88 kg)   LMP 08/20/2008 (Approximate)   BMI (P) 32.28 kg/m  General appearance:  WNWD Female, NAD Abd:  Soft, NT, ND, no masses, hernias, or organomegaly Pelvic exam:  VULVA: normal appearing vulva with no masses, tenderness or lesions VAGINA: normal appearing vagina with normal color and discharge, no lesions, atrophy noted CERVIX: normal appearing cervix without discharge or lesions, Pap obtained. UTERUS: uterus is normal size, shape, consistency and nontender ADNEXA: normal adnexa in size, nontender and no masses RECTAL: normal rectal, no masses CVA:  No flank tenderness  PAP: Pap smear done today   Assessment: History of ASCUS-H pap with HR HPV changes on biopsy only noted  Dysuria  Plan:  Pap only obtained today.  As long as no high grade findings, will repeat at AEX as per guidelines Urine micro and culture pending Macrobid 100mg  bid x 7 days.

## 2015-09-13 NOTE — Telephone Encounter (Signed)
Yes, please add macrobid as an allergy.  Thanks.

## 2015-09-14 NOTE — Telephone Encounter (Signed)
Allergy to Macrobid entered. Encounter closed.

## 2015-09-27 ENCOUNTER — Ambulatory Visit (INDEPENDENT_AMBULATORY_CARE_PROVIDER_SITE_OTHER): Payer: BLUE CROSS/BLUE SHIELD | Admitting: Obstetrics & Gynecology

## 2015-09-27 DIAGNOSIS — R87611 Atypical squamous cells cannot exclude high grade squamous intraepithelial lesion on cytologic smear of cervix (ASC-H): Secondary | ICD-10-CM

## 2015-09-27 NOTE — Patient Instructions (Signed)

## 2015-09-27 NOTE — Progress Notes (Signed)
60 y.o. Widowed Caucasian female here for colposcopy with possible biopsies and/or ECC due to ASCUS-H Pap obtained 09/07/15.  H/O ASCUS pap with +HR HPV 1/17.  Colpo done 02/23/15 with only HPV findings noted.    Pt now working at the [employer] [facility] [employer] [facility] [employer] where "everyone has cancer" so she is anxoius about this finding.  Prior evaluation/treatment:  Remote hx of conization.  Patient's last menstrual period was 08/20/2008 (approximate).          Sexually active: Yes.    The current method of family planning is post menopausal status.     Patient has been counseled about results and procedure.  Risks and benefits have bene reviewed including immediate and/or delayed bleeding, infection, cervical scaring from procedure, possibility of needing additional follow up as well as treatment.  rare risks of missing a lesion discussed as well.  All questions answered.  Pt ready to proceed.  BP 120/70 (BP Location: Right Arm, Patient Position: Sitting, Cuff Size: Normal)   Pulse 74   Resp 18   Ht 5\' 5"  (1.651 m)   Wt 194 lb (88 kg)   LMP 08/20/2008 (Approximate)   BMI 32.28 kg/m   Physical Exam  Constitutional: She is oriented to person, place, and time. She appears well-developed and well-nourished.  Genitourinary: There is no rash, tenderness, lesion or injury on the right labia. There is no rash, tenderness, lesion or injury on the left labia.    Neurological: She is alert and oriented to person, place, and time.  Skin: Skin is warm and dry.  Psychiatric: She has a normal mood and affect.    Speculum placed.  3% acetic acid applied to cervix for >45 seconds.  Cervix visualized with both 7.5X and 15X magnification.  Green filter also used.  Lugols solution was used.  Findings:  Small area of decreased staining far away from cervix.  Biopsy:  6 o'clock.  ECC:  was performed.  Monsel's was needed.  Excellent hemostasis was present.  Pt tolerated procedure well and all instruments were removed.   Findings noted above on picture of cervix.  Assessment:  ASCUS H pap in pt with prior ASCUS pap and +HR HPV finding.  Plan:  Pathology results will be called to patient and follow-up planned pending results.

## 2015-10-01 LAB — IPS OTHER TISSUE BIOPSY

## 2015-10-02 ENCOUNTER — Telehealth: Payer: Self-pay | Admitting: *Deleted

## 2015-10-02 DIAGNOSIS — Z8742 Personal history of other diseases of the female genital tract: Secondary | ICD-10-CM

## 2015-10-02 DIAGNOSIS — N870 Mild cervical dysplasia: Secondary | ICD-10-CM

## 2015-10-02 NOTE — Telephone Encounter (Signed)
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  She and I discussed LEEP due to the ASCUS H pap.  She was in agreement so I want to proceed with that at this time.  Please schedule.  Out of current recall.

## 2015-10-02 NOTE — Telephone Encounter (Signed)
Call to patient to review colpo result from Dr Hyacinth MeekerMiller. Per ROI can leave detailed message on cell number. Left message to retrun call to review results and options.

## 2015-10-04 NOTE — Telephone Encounter (Signed)
Patient is returning a call to [place]. Patient is aware that [name] is out of the office today. Patient is asking if some one else could call her today.

## 2015-10-04 NOTE — Telephone Encounter (Signed)
Spoke with patient. Advised of results and message as seen below from Dr.Miller. Patient is agreeable and verbalizes understanding. Patient would like to proceed with scheduling LEEP procedure. Requesting a Friday appointment. Appointment scheduled for 10/12/2015 at 3:30 pm with Dr.Miller. Patient is agreeable to date and time.  Instructions given. Motrin 800 mg po x , one hour before appointment with food. Make sure to eat a meal before appointment and drink plenty of fluids. Patient agreeable and verbalized understanding of all instructions. Patient will have someone to drive her to and from her appointment.  She is aware that it is recommended that she take it easy and rest for 24 hours following her procedure. Patient is agreeable. Order has been placed for LEEP procedure.  Cc: Harland DingwallSuzy Dixon  Routing to provider for final review. Patient agreeable to disposition. Will close encounter.

## 2015-10-12 ENCOUNTER — Ambulatory Visit (INDEPENDENT_AMBULATORY_CARE_PROVIDER_SITE_OTHER): Payer: BLUE CROSS/BLUE SHIELD | Admitting: Obstetrics & Gynecology

## 2015-10-12 ENCOUNTER — Encounter: Payer: Self-pay | Admitting: Obstetrics & Gynecology

## 2015-10-12 ENCOUNTER — Ambulatory Visit: Payer: Self-pay | Admitting: Family Medicine

## 2015-10-12 VITALS — BP 112/70 | HR 72 | Resp 18 | Ht 65.5 in | Wt 196.4 lb

## 2015-10-12 DIAGNOSIS — R87611 Atypical squamous cells cannot exclude high grade squamous intraepithelial lesion on cytologic smear of cervix (ASC-H): Secondary | ICD-10-CM

## 2015-10-12 DIAGNOSIS — N870 Mild cervical dysplasia: Secondary | ICD-10-CM

## 2015-10-12 DIAGNOSIS — Z8742 Personal history of other diseases of the female genital tract: Secondary | ICD-10-CM

## 2015-10-12 NOTE — Progress Notes (Signed)
60 y.o. G2P2 Widowed Caucasian Female here for LEEP due to ASCUS cannot exclude high grade Pap obtained 09/07/15 with CIN 1 and negative ECC on colposcopy 09/27/15.  Pt currently works [facility] [employer] and is desirous of treatment.  She is here for this today.   Patient's last menstrual period was 08/20/2008 (approximate).          Sexually active: Yes.    The current method of family planning is post menopausal status.     Pre-procedure vitals: Blood pressure 112/70, pulse 72, resp. rate 18, height 5' 5.5" (1.664 m), weight 196 lb 6.4 oz (89.1 kg), last menstrual period 08/20/2008.   Procedure explained and patient's questions were invited and answered.   Consent form signed.  Pre-procedure medication:  800mg  Motrin po.  Time given:  About 60 minutes before procedure.  Procedure Set-up: Grounding pad located left thigh.  Cautery settings: 55 cut/55 coagulation.  Suction applied to coated speculum.  Procedure:  Speculum placed with good visualization of the cervix.  Colposcopy performed showing:  visible lesion(s) at 5 o'clock that is only present with application of Lugol's solution.  Cervix anesthetized using 2% Xylocaine with 1:100,000units Epinephrine.  10 cc's used.  Entire transition zone excised with 12  X 15mm loop in 2 passes.  Specimen(s) placed on cork and labeled for pathology.  Hemostasis obtained with ball cautery and Monsel's solution.  EBL:  Minimal  Complications:  none  Patient tolerated procedure well and left the office in satisfactory condition.  Plan:  After visit summary given.  Repeat pap will be planned pending results from pathology.

## 2015-10-16 LAB — IPS OTHER TISSUE BIOPSY

## 2015-10-18 ENCOUNTER — Telehealth: Payer: Self-pay

## 2015-10-18 NOTE — Telephone Encounter (Signed)
Spoke with patient. Advised of results and message as seen below from Dr.Miller. Patient is agreeable and verbalizes understanding. Aex rescheduled to 04/03/2016 at 9:15 am with Dr.Miller. Patient is agreeable to date and time. 08 recall placed.  Routing to provider for final review. Patient agreeable to disposition. Will close encounter.

## 2015-10-18 NOTE — Telephone Encounter (Signed)
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  Margins were negative.  Repeat pap six months.  Has AEX scheduled for January.  Please change to March and will do this at same time of AEX.  08 recall for 6 months.  Thanks.

## 2015-10-19 ENCOUNTER — Telehealth: Payer: Self-pay | Admitting: Obstetrics & Gynecology

## 2015-10-19 ENCOUNTER — Other Ambulatory Visit: Payer: Self-pay | Admitting: Nurse Practitioner

## 2015-10-19 MED ORDER — CIPROFLOXACIN HCL 500 MG PO TABS: 500.0000 mg | ORAL_TABLET | Freq: Two times a day (BID) | ORAL | 0 refills | Status: DC

## 2015-10-19 NOTE — Telephone Encounter (Signed)
Spoke with patient. Advised of message as seen below from Ria CommentPatricia Grubb, FNP. Patient is agreeable and verbalizes understanding. Aware if her symptoms persist or worsen with taking Cipro she will need to be seen while out of town at a local urgent care for assessment. Patient is agreeable. 2 week nurse recheck scheduled for 11/02/2015 at 10:30 am. Patient is agreeable to date and time.  Routing to provider for final review. Patient agreeable to disposition. Will close encounter.

## 2015-10-19 NOTE — Telephone Encounter (Signed)
Sound like she stopped Macrobid secondary to adverse reaction.  When she came back in for procedure was not symptomatic and did not require another medication.  But now more symptomatic.  With the degree of WBC, bacteria on micro even with a negative urine culture makes me believe she has something going on.  I would favor treating her even if she is unable to come in.   Have her to start on Cipro 500 mg BID # 14.  Then schedule a TOC in 2 weeks with micro and culture.  Order is placed.

## 2015-10-19 NOTE — Telephone Encounter (Signed)
Patient is going out of town and thinks she may have a uti. Would like to have something called [facility] to the [facility] at [phone].

## 2015-10-19 NOTE — Telephone Encounter (Signed)
Spoke with patient. Patient states that two days ago she began to have burning with urination. Denies lower back pain, fever, chills, urinary frequency or urgency. Reports she had UTI symptoms in August and was given Macrobid, but had a reaction after taking two doses of the medication. Reports hands and feet were red, itchy, and burned. Patient states her symptoms went away shortly after stopping Macrobid. Reports she was seen on 10/12/2015 for a LEEP procedure and was not having symptoms at that time. Patient is leaving to [place] and is requesting an rx for UTI. Advised patient we are unable to prescribed medication for a UTI over the phone. Patient would like for me to speak with Ria CommentPatricia Grubb, FNP as she is unable to be seen due to travel and does not feel comfortable being seen elsewhere. Advised I will speak with Ria CommentPatricia Grubb, FNP and return call. Patient is agreeable.

## 2015-10-22 ENCOUNTER — Telehealth: Payer: Self-pay | Admitting: Obstetrics & Gynecology

## 2015-10-22 NOTE — Telephone Encounter (Signed)
Patient is having some spotting after recent leep procedure. Patient is asking if this is normal?

## 2015-10-22 NOTE — Telephone Encounter (Signed)
Spoke with patient. Patient had LEEP on 10/12/15. Patient reports spotting and "black discharge" starting on 10/19/15 with mild abdominal cramping. Patient reports changing panty liner q4h. Advised patient this was normal, to continue monitor, can last 2-3 weeks after procedure. Return call to office if bleeding increases to changing pad/hr or with any additional questions. Patient then states she is unsure, but thinks she may have rectal bleeding. Patient can not tell if coming from vagina or rectum when wiping. Patient reports no pain or straining with BM. Last BM 10/22/15, reports normal stool. Denies external hemorrhoids. Suggested OV for further evaluation if patient unable to determine location of bleeding. Patient states limited availability due to classes and going out of town. Advised would review schedule and return call. Patient is agreeable.

## 2015-10-22 NOTE — Telephone Encounter (Signed)
Spoke with patient. Office visit scheduled for Tuesday 10/23/15 at 1115. Patient agreeable to date and time of appointment.   Routing to provider for final review. Patient agreeable to disposition. Will close encounter.

## 2015-10-23 ENCOUNTER — Ambulatory Visit (INDEPENDENT_AMBULATORY_CARE_PROVIDER_SITE_OTHER): Payer: BLUE CROSS/BLUE SHIELD | Admitting: Obstetrics & Gynecology

## 2015-10-23 ENCOUNTER — Encounter: Payer: Self-pay | Admitting: Obstetrics & Gynecology

## 2015-10-23 VITALS — BP 108/70 | HR 70 | Resp 14 | Ht 65.0 in | Wt 196.6 lb

## 2015-10-23 DIAGNOSIS — N939 Abnormal uterine and vaginal bleeding, unspecified: Secondary | ICD-10-CM | POA: Diagnosis not present

## 2015-10-23 DIAGNOSIS — N870 Mild cervical dysplasia: Secondary | ICD-10-CM | POA: Diagnosis not present

## 2015-10-23 NOTE — Progress Notes (Signed)
GYNECOLOGY  VISIT   HPI: 60 y.o. 342P2002 Widowed Caucasian female with vaginal bleeding after her LEEP on 10/12/15.  Pt reports no bleeding for several days until over the weekend, she started having some increased vaginal bleeding with discharge.  Denies odor or cramping.  Denies fever.  Just wanted to check and be sure this wasn't anything significant.  Bleeding is not heavy.  Pt also wanted to make sure this is not rectal bleeding.  States she just can't tell.  Patient Active Problem List   Diagnosis Date Noted  . Tibialis tenosynovitis 01/04/2015  . Pain in lower limb 01/04/2015  . Migraine with aura   . Moderate episode of recurrent major depressive disorder (HCC) 01/15/2009  . Avitaminosis D 05/12/2008    Past Medical History:  Diagnosis Date  . Arthritis   . Depression   . Migraine with aura     Past Surgical History:  Procedure Laterality Date  . ABLATION  07/2003   HTA  . BLADDER SUSPENSION  11/29/09   with pelvic hematoma  . 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 KNEE ARTHROSCOPY Left 07/2003    MEDS:  Reviewed in EPIC and UTD  ALLERGIES: Macrobid [nitrofurantoin monohyd macro] and Sulfa antibiotics  Family History  Problem Relation Age of Onset  . Arthritis Mother   . Hypertension Mother   . Arthritis Father   . Hypertension Father   . Diabetes Father     type ll  . Heart disease Father     a fib  . Cancer Maternal Grandmother     lung  . Hypertension Maternal Grandmother     SH:  Widowed, non smoker  Review of Systems  All other systems reviewed and are negative.   PHYSICAL EXAMINATION:    BP 108/70 (BP Location: Right Arm, Patient Position: Sitting, Cuff Size: Normal)   Pulse 70   Resp 14   Ht 5\' 5"  (1.651 m)   Wt 196 lb 9.6 oz (89.2 kg)   LMP 08/20/2008 (Approximate)   BMI 32.72 kg/m     General appearance: alert, cooperative and appears stated age  Pelvic: External genitalia:  no lesions              Urethra:  normal appearing  urethra with no masses, tenderness or lesions              Bartholins and Skenes: normal                 Vagina: normal appearing vagina with normal color and minimal discharge, no odor              Cervix: s/p LEEP with small amount of bleeding from location on cone bed where tissue is sloughing off.   Liquid monsels applied to this area and bleeding stopped.             Bimanual Exam:  Uterus:  normal size, contour, position, consistency, mobility, non-tender and non tender             Rectovaginal: Yes.    No hemorrhoids or masses.  No bleeding noted.  Chaperone was present for exam.  Assessment: S/p LEEP with minimal bleeding from cone bed, stopped with monsel's solution today  Plan: Pt aware nothing in the vagina until bleeding completely subsides.   Follow up in march is already scheduled.

## 2015-11-02 ENCOUNTER — Ambulatory Visit (INDEPENDENT_AMBULATORY_CARE_PROVIDER_SITE_OTHER): Payer: BLUE CROSS/BLUE SHIELD

## 2015-11-02 VITALS — BP 118/70 | HR 68 | Resp 16 | Ht 65.0 in | Wt 195.0 lb

## 2015-11-02 DIAGNOSIS — N3001 Acute cystitis with hematuria: Secondary | ICD-10-CM | POA: Diagnosis not present

## 2015-11-02 NOTE — Progress Notes (Signed)
Patient here for a urine micro and urine culture. Completed course of Cipro and is doing better. Culture and Micro drawn up and sent to the lab.  Closing encounter.

## 2015-11-03 LAB — URINALYSIS, MICROSCOPIC ONLY
Bacteria, UA: NONE SEEN [HPF]
Casts: NONE SEEN [LPF]
Crystals: NONE SEEN [HPF]
WBC, UA: NONE SEEN WBC/HPF (ref <=5)
Yeast: NONE SEEN [HPF]

## 2015-11-04 LAB — URINE CULTURE

## 2016-01-24 ENCOUNTER — Other Ambulatory Visit: Payer: Self-pay | Admitting: Family Medicine

## 2016-02-01 ENCOUNTER — Encounter: Payer: Self-pay | Admitting: Family Medicine

## 2016-02-01 ENCOUNTER — Ambulatory Visit (INDEPENDENT_AMBULATORY_CARE_PROVIDER_SITE_OTHER): Payer: Self-pay | Admitting: Family Medicine

## 2016-02-01 VITALS — BP 100/80 | HR 69 | Temp 97.8°F | Ht 65.0 in | Wt 198.5 lb

## 2016-02-01 DIAGNOSIS — Z0000 Encounter for general adult medical examination without abnormal findings: Secondary | ICD-10-CM

## 2016-02-01 LAB — BASIC METABOLIC PANEL
BUN: 16 mg/dL (ref 6–23)
CO2: 29 mEq/L (ref 19–32)
Calcium: 9.5 mg/dL (ref 8.4–10.5)
Chloride: 104 mEq/L (ref 96–112)
Creatinine, Ser: 0.7 mg/dL (ref 0.40–1.20)
GFR: 91.1 mL/min (ref >60.00)
Glucose, Bld: 78 mg/dL (ref 70–99)
Potassium: 4.4 mEq/L (ref 3.5–5.1)
Sodium: 139 mEq/L (ref 135–145)

## 2016-02-01 LAB — CBC WITH DIFFERENTIAL/PLATELET
Basophils Absolute: 0.1 10*3/uL (ref 0.0–0.1)
Basophils Relative: 1.5 % (ref 0.0–3.0)
Eosinophils Absolute: 0.2 10*3/uL (ref 0.0–0.7)
Eosinophils Relative: 3.1 % (ref 0.0–5.0)
HCT: 41.6 % (ref 36.0–46.0)
Hemoglobin: 14.3 g/dL (ref 12.0–15.0)
Lymphocytes Relative: 37.3 % (ref 12.0–46.0)
Lymphs Abs: 2.5 10*3/uL (ref 0.7–4.0)
MCHC: 34.4 g/dL (ref 30.0–36.0)
MCV: 90.8 fl (ref 78.0–100.0)
Monocytes Absolute: 0.6 10*3/uL (ref 0.1–1.0)
Monocytes Relative: 9.3 % (ref 3.0–12.0)
Neutro Abs: 3.2 10*3/uL (ref 1.4–7.7)
Neutrophils Relative %: 48.8 % (ref 43.0–77.0)
Platelets: 343 10*3/uL (ref 150.0–400.0)
RBC: 4.58 Mil/uL (ref 3.87–5.11)
RDW: 14.1 % (ref 11.5–15.5)
WBC: 6.6 10*3/uL (ref 4.0–10.5)

## 2016-02-01 LAB — LIPID PANEL
Cholesterol: 166 mg/dL (ref 0–200)
HDL: 78 mg/dL (ref >39.00)
LDL Cholesterol: 77 mg/dL (ref 0–99)
NonHDL: 88.34
Total CHOL/HDL Ratio: 2
Triglycerides: 57 mg/dL (ref 0.0–149.0)
VLDL: 11.4 mg/dL (ref 0.0–40.0)

## 2016-02-01 LAB — HEPATIC FUNCTION PANEL
ALT: 13 U/L (ref 0–35)
AST: 14 U/L (ref 0–37)
Albumin: 4.3 g/dL (ref 3.5–5.2)
Alkaline Phosphatase: 70 U/L (ref 39–117)
Bilirubin, Direct: 0.1 mg/dL (ref 0.0–0.3)
Total Bilirubin: 0.4 mg/dL (ref 0.2–1.2)
Total Protein: 6.8 g/dL (ref 6.0–8.3)

## 2016-02-01 LAB — VITAMIN D 25 HYDROXY (VIT D DEFICIENCY, FRACTURES): VITD: 26.27 ng/mL — ABNORMAL LOW (ref 30.00–100.00)

## 2016-02-01 LAB — TSH: TSH: 1.85 u[IU]/mL (ref 0.35–4.50)

## 2016-02-01 MED ORDER — BUPROPION HCL ER (XL) 150 MG PO TB24: 150.0000 mg | ORAL_TABLET | Freq: Every day | ORAL | 5 refills | Status: DC

## 2016-02-01 NOTE — Progress Notes (Signed)
Subjective:     Patient ID: Amy Davila, female   DOB: [birth year], 61 y.o.   MRN: [id number]  HPI  Patient seen for physical. She sees gynecologist yearly. 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 She has past history of depression and is doing very well and tapered herself off of Celexa within the past year. She would like to taper back her dose of Wellbutrin. Recent left knee injury and is waiting on MRI scan for that. That has limited her activities. Overall feels well. No flu vaccine and she declines. Other immunizations up-to-date. Colonoscopy up-to-date.  Past Medical History:  Diagnosis Date  . Arthritis   . Depression   . Migraine with aura    Past Surgical History:  Procedure Laterality Date  . ABLATION  07/2003   HTA  . BLADDER SUSPENSION  11/29/09   with pelvic hematoma  . 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 KNEE ARTHROSCOPY Left 07/2003    reports that she has never smoked. She has never used smokeless tobacco. She reports that she drinks about 3.0 - 4.2 oz of alcohol per week . She reports that she does not use drugs. family history includes Arthritis in her father and mother; Cancer in her maternal grandmother; Diabetes in her father; Heart disease in her father; Hypertension in her father, maternal grandmother, and mother. Allergies  Allergen Reactions  . Macrobid [employer] Macro] Other (See Comments)    Itching, hot hands and feet, increased with two doses  . Sulfa Antibiotics     Yeast infection occurs     Review of Systems  Constitutional: Negative for activity change, appetite change, fatigue, fever and unexpected weight change.  HENT: Negative for ear pain, hearing loss, sore throat and trouble swallowing.   Eyes: Negative for visual disturbance.  Respiratory: Negative for cough and shortness of breath.   Cardiovascular:  Negative for chest pain and palpitations.  Gastrointestinal: Negative for abdominal pain, blood in stool, constipation and diarrhea.  Genitourinary: Negative for dysuria and hematuria.  Musculoskeletal: Positive for arthralgias. Negative for back pain and myalgias.  Skin: Negative for rash.  Neurological: Negative for dizziness, syncope and headaches.  Hematological: Negative for adenopathy.  Psychiatric/Behavioral: Negative for confusion and dysphoric mood.       Objective:   Physical Exam  Constitutional: She is oriented to person, place, and time. She appears well-developed and well-nourished.  HENT:  Head: Normocephalic and atraumatic.  Eyes: EOM are normal. Pupils are equal, round, and reactive to light.  Neck: Normal range of motion. Neck supple. No thyromegaly present.  Cardiovascular: Normal rate, regular rhythm and normal heart sounds.   No murmur heard. Pulmonary/Chest: Breath sounds normal. No respiratory distress. She has no wheezes. She has no rales.  Abdominal: Soft. Bowel sounds are normal. She exhibits no distension and no mass. There is no tenderness. There is no rebound and no guarding.  Musculoskeletal: Normal range of motion. She exhibits no edema.  Has a brace on her left knee  Lymphadenopathy:    She has no cervical adenopathy.  Neurological: She is alert and oriented to person, place, and time. She displays normal reflexes. No cranial nerve deficit.  Skin: No rash noted.  Psychiatric: She has a normal mood and affect. Her behavior is normal. Judgment and thought content normal.       Assessment:  Physical exam. Screenings up-to-date. She declines flu vaccine    Plan:     -Obtain screening lab work We had discussion regarding tapering back Wellbutrin which she desires to do. We'll reduce her dose to 150 mg once daily and then after a few weeks she will consider whether she wishes to titrate off at that point. Follow-up for any recurrent depression  symptoms -She'll continue with regular GYN follow-up  Kristian CoveyBruce W Zorion Nims MD [facility]

## 2016-02-01 NOTE — Progress Notes (Signed)
Pre visit review using our clinic review tool, if applicable. No additional management support is needed unless otherwise documented below in the visit note. 

## 2016-02-11 ENCOUNTER — Telehealth: Payer: Self-pay | Admitting: Obstetrics & Gynecology

## 2016-02-11 NOTE — Telephone Encounter (Signed)
Spoke with patient. Patient states that she is having urinary frequency, burning with urination, and is unable to completely empty her bladder. Reports drinking lots of water and cranberry juice with persistent symptoms. Denies any lower back pain, fever, or chills. Offered patient an appointment this morning, but patient declines. Appointment scheduled for tomorrow 02/12/2016 at 9:15 am with Dr.Miller. Agreeable to date and time. Advised if symptoms worsen or develops new symptoms will need to be seen for further evaluation with Urgent Care or ER. Patient is agreeable.  Routing to provider for final review. Patient agreeable to disposition. Will close encounter.

## 2016-02-11 NOTE — Telephone Encounter (Signed)
Patient called and cancelled her appointment for a "uti?" on 02/11/16 with Dr. Hyacinth MeekerMiller due to "boss scheduled a mandatory client."  Patient cannot come in at any other time tomorrow due to her work schedule.  Patient aware a nurse will call her back to assist with follow up.  Pharmacy on file is correct, if needed.  Did not count as a missed appointment because it was just scheduled mid-day today.

## 2016-02-11 NOTE — Telephone Encounter (Signed)
Spoke with patient. Patient states she will have to be seen on Wednesday. Advised patient Dr. Hyacinth MeekerMiller is out of the office on Wednesdays, can schedule with a covering provider. Patient scheduled with Amy CommentPatricia Grubb, Amy Davila 02/13/16 at 0900. Advised patient should symptoms worsen -fever, chills or lower back pain develop -seek care at local ER or urgent care. Patient verbalizes understanding and is agreeable to date and time. Also see telephone encounter dated 02/11/16.  Routing to provider for final review. Patient is agreeable to disposition. Will close encounter.   Cc: Amy CommentPatricia Grubb, Amy Davila

## 2016-02-11 NOTE — Telephone Encounter (Signed)
Patient called and said, "I'd like to speak with the nurse to see if she can help me over the phone.  If not, I'll go to an urgent care clinic.  I think I have a urinary tract infection.  I've been drinking lots of water but it is getting worse instead of better.  [place] to [place] in [place] day today and then be back first thing in the morning so I am not available to come in."

## 2016-02-12 ENCOUNTER — Ambulatory Visit: Payer: BLUE CROSS/BLUE SHIELD | Admitting: Obstetrics & Gynecology

## 2016-02-12 NOTE — Progress Notes (Signed)
Patient ID: Amy BatheJanet W Davila, female   DOB: 07/23/1955, 61 y.o.   MRN: [id number]  61 y.o.Widowed Caucasian female G2P2002 here with complaint of UTI, with onset 1 week ago. Patient complaining of:  dysuria, urgency, burning. Patient denies fever, chills, nausea or back pain. No new personal products. Patient feels not related to sexual activity as she is not SA. Denies vaginal symptoms.    Contraception is postmenopausal.  Patient drinking adequate water intake.  She has had recent problems with abnormal pap and needed to have a LEEP for ASCUS cannot exclude high grade on 10/12/15.  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   O: Healthy female WDWN Affect: Normal, orientation x 3 Skin : warm and dry CVAT: negative bilateral Abdomen: negative for suprapubic tenderness  Pelvic exam: External genital area: normal, no lesions Bladder,Urethra tender, Urethral meatus: normal with a little prolapse but not red Vagina: white vaginal discharge, normal appearance - Affirm is taken   POCT:  Urine: large leuk's, 3+ RBC, 1+ protein  A:  R/O UTI  R/O vaginitis as cause of UTI  P: Reviewed findings of UTI and need for treatment. Rx:  Cipro 500 mg BID # 14; Pyridium 200 mg TID prn. XLK:GMWNULab:Urine micro, culture, and Affirm Reviewed warning signs and symptoms of UTI and need to advise if occurring. Encouraged to limit soda, tea, and coffee  Discussed sometimes vaginal estrogen cream at the introitus help to decrease number of UTI -she is due for a recheck pap at [facility] in March and will discuss this with Dr. Hyacinth MeekerMiller then.   RV prn

## 2016-02-13 ENCOUNTER — Other Ambulatory Visit: Payer: Self-pay | Admitting: Family Medicine

## 2016-02-13 ENCOUNTER — Encounter: Payer: Self-pay | Admitting: Nurse Practitioner

## 2016-02-13 ENCOUNTER — Ambulatory Visit (INDEPENDENT_AMBULATORY_CARE_PROVIDER_SITE_OTHER): Payer: Self-pay | Admitting: Nurse Practitioner

## 2016-02-13 VITALS — BP 110/80 | HR 60 | Resp 16 | Ht 65.0 in | Wt 197.8 lb

## 2016-02-13 DIAGNOSIS — R300 Dysuria: Secondary | ICD-10-CM

## 2016-02-13 DIAGNOSIS — Z1231 Encounter for screening mammogram for malignant neoplasm of breast: Secondary | ICD-10-CM

## 2016-02-13 LAB — POCT URINALYSIS DIPSTICK
Bilirubin, UA: NEGATIVE
Glucose, UA: NEGATIVE
Ketones, UA: NEGATIVE
Nitrite, UA: NEGATIVE
Urobilinogen, UA: NEGATIVE
pH, UA: >=9

## 2016-02-13 MED ORDER — PHENAZOPYRIDINE HCL 200 MG PO TABS: 200.0000 mg | ORAL_TABLET | Freq: Three times a day (TID) | ORAL | 0 refills | Status: DC | PRN

## 2016-02-13 MED ORDER — CIPROFLOXACIN HCL 500 MG PO TABS: 500.0000 mg | ORAL_TABLET | Freq: Two times a day (BID) | ORAL | 0 refills | Status: DC

## 2016-02-13 NOTE — Patient Instructions (Signed)

## 2016-02-14 ENCOUNTER — Ambulatory Visit: Payer: Self-pay | Admitting: Obstetrics & Gynecology

## 2016-02-14 LAB — WET PREP BY MOLECULAR PROBE
Candida species: NEGATIVE
Gardnerella vaginalis: NEGATIVE
Trichomonas vaginosis: NEGATIVE

## 2016-02-14 LAB — URINALYSIS, MICROSCOPIC ONLY
Casts: NONE SEEN [LPF]
Crystals: NONE SEEN [HPF]
Yeast: NONE SEEN [HPF]

## 2016-02-15 ENCOUNTER — Ambulatory Visit: Payer: BLUE CROSS/BLUE SHIELD | Admitting: Nurse Practitioner

## 2016-02-15 LAB — URINE CULTURE

## 2016-02-17 NOTE — Progress Notes (Signed)
Encounter reviewed by Dr. Brook Amundson C. Silva.  

## 2016-03-14 ENCOUNTER — Ambulatory Visit
Admission: RE | Admit: 2016-03-14 | Discharge: 2016-03-14 | Disposition: A | Discharge status: Home / Self Care | Payer: Self-pay | Source: Ambulatory Visit | Attending: Family Medicine | Admitting: Family Medicine

## 2016-03-14 DIAGNOSIS — Z1231 Encounter for screening mammogram for malignant neoplasm of breast: Secondary | ICD-10-CM

## 2016-04-02 NOTE — Progress Notes (Signed)
61 y.o. 382P2002 Widowed Caucasian F here for annual exam.  Doing really well.  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  So happy to be done.  Denies vaginal bleeding.    Had recent UTI that was treated.  Urine had blood in it.  Also has blood in urine last year after leep but she was bleeding then as well.    Patient's last menstrual period was 08/20/2008 (approximate).          Sexually active: No.  The current method of family planning is post menopausal status.    Exercising: Yes.    walking Smoker:  no  Health Maintenance: Pap:  09/07/15 ASCUS H, 09/27/15 colposcopy- CIN 1, ECC negative, 10/12/15 LEEP- CIN 1, HPV changes, negative margins History of abnormal Pap:  yes MMG:  03/14/16 BIRADS 1 negative  Colonoscopy:  05/31/14 normal- repeat 10 years  BMD:   2005- normal  TDaP:  02/08/13  Pneumonia vaccine(s):  never Zostavax:   never Hep C testing: 02/13/15 - negative Screening Labs: PCP takes care of all labs   reports that she has never smoked. She has never used smokeless tobacco. She reports that she drinks about 3.0 - 4.2 oz of alcohol per week . She reports that she does not use drugs.  Past Medical History:  Diagnosis Date  . Arthritis   . Depression   . Migraine with aura     Past Surgical History:  Procedure Laterality Date  . ABLATION  07/2003   HTA  . BLADDER SUSPENSION  11/29/09   with pelvic hematoma  . 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 KNEE ARTHROSCOPY Left 07/2003    Current Outpatient Prescriptions  Medication Sig Dispense Refill  . buPROPion (WELLBUTRIN XL) 150 MG 24 hr tablet Take 1 tablet (150 mg total) by mouth daily. 30 tablet 5  . calcium-vitamin D (OSCAL WITH D) 500-200 MG-UNIT per tablet Take 1 tablet by mouth daily.      . MELOXICAM PO Take by mouth daily.     No current facility-administered medications for this visit.     Family History  Problem Relation Age of Onset  . Arthritis Mother   . Hypertension Mother   . Arthritis  Father   . Hypertension Father   . Diabetes Father     type ll  . Heart disease Father     a fib  . Cancer Maternal Grandmother     lung  . Hypertension Maternal Grandmother   . Breast cancer Neg Hx     ROS:  Pertinent items are noted in HPI.  Otherwise, a comprehensive ROS was negative.  Exam:   BP 120/70 (BP Location: Right Arm, Patient Position: Sitting, Cuff Size: Normal)   Pulse 72   Resp 16   Ht 5\' 7"  (1.702 m)   Wt 198 lb (89.8 kg)   LMP 08/20/2008 (Approximate)   BMI 31.01 kg/m   Weight change: +6#   Height: 5\' 7"  (170.2 cm)  Ht Readings from Last 3 Encounters:  04/03/16 5\' 7"  (1.702 m)  02/13/16 5\' 5"  (1.651 m)  02/01/16 5\' 5"  (1.651 m)    General appearance: alert, cooperative and appears stated age Head: Normocephalic, without obvious abnormality, atraumatic Neck: no adenopathy, supple, symmetrical, trachea midline and thyroid normal to inspection and palpation Lungs: clear to auscultation bilaterally Breasts: normal appearance, no masses or tenderness Heart: regular rate and rhythm Abdomen: soft, non-tender; bowel sounds normal; no  masses,  no organomegaly Extremities: extremities normal, atraumatic, no cyanosis or edema Skin: Skin color, texture, turgor normal. No rashes or lesions Lymph nodes: Cervical, supraclavicular, and axillary nodes normal. No abnormal inguinal nodes palpated Neurologic: Grossly normal   Pelvic: External genitalia:  no lesions              Urethra:  normal appearing urethra with no masses, tenderness or lesions              Bartholins and Skenes: normal                 Vagina: normal appearing vagina with normal color and discharge, no lesions              Cervix: no lesions              Pap taken: Yes.   Bimanual Exam:  Uterus:  normal size, contour, position, consistency, mobility, non-tender              Adnexa: normal adnexa and no mass, fullness, tenderness               Rectovaginal: Confirms               Anus:  normal  sphincter tone, no lesions  Chaperone was present for exam.  A:  Well Woman with normal exam PMP, no HRT H/O abnormal pap smear with LEEP 9/17 showing CIN 1 and negative margins, inflammation Situational anxiety and depression Arthritis, recent left knee pain with cortisone injection Microscopic hematuria  P:   Mammogram guidelines reviewed pap smear obtained today (will reflex HR HPV if abnormal) Lab work and vaccines up to date Urine micro and culture pending--pt could not void and will return for this.   return annually or prn

## 2016-04-03 ENCOUNTER — Telehealth: Payer: Self-pay | Admitting: *Deleted

## 2016-04-03 ENCOUNTER — Ambulatory Visit (INDEPENDENT_AMBULATORY_CARE_PROVIDER_SITE_OTHER): Payer: Self-pay | Admitting: Obstetrics & Gynecology

## 2016-04-03 ENCOUNTER — Other Ambulatory Visit (HOSPITAL_COMMUNITY)
Admission: RE | Admit: 2016-04-03 | Discharge: 2016-04-03 | Disposition: A | Discharge status: Home / Self Care | Payer: Self-pay | Source: Ambulatory Visit | Attending: Obstetrics & Gynecology | Admitting: Obstetrics & Gynecology

## 2016-04-03 ENCOUNTER — Encounter: Payer: Self-pay | Admitting: Obstetrics & Gynecology

## 2016-04-03 VITALS — BP 120/70 | HR 72 | Resp 16 | Ht 67.0 in | Wt 198.0 lb

## 2016-04-03 DIAGNOSIS — Z01419 Encounter for gynecological examination (general) (routine) without abnormal findings: Secondary | ICD-10-CM | POA: Insufficient documentation

## 2016-04-03 DIAGNOSIS — N870 Mild cervical dysplasia: Secondary | ICD-10-CM

## 2016-04-03 DIAGNOSIS — R3129 Other microscopic hematuria: Secondary | ICD-10-CM

## 2016-04-03 DIAGNOSIS — Z124 Encounter for screening for malignant neoplasm of cervix: Secondary | ICD-10-CM

## 2016-04-03 NOTE — Telephone Encounter (Signed)
Left message per DPR for patient to [facility] for urine culture and micro.

## 2016-04-08 LAB — CYTOLOGY - PAP
Adequacy: ABSENT
Diagnosis: NEGATIVE

## 2016-04-10 NOTE — Telephone Encounter (Signed)
Call to patient. Results given as seen below from Dr. Hyacinth MeekerMiller and patient verbalized understanding. 06 recall in place. 6 month follow up visit scheduled for Friday 10/10/16 at 1600. Patient also scheduled for nurse visit for urine culture and micro on Friday 04/11/16 at 1400. Patient agreeable to date and time of both appointments. Future orders present.  Routing to provider for final review. Patient agreeable to disposition. Will close encounter.

## 2016-04-10 NOTE — Telephone Encounter (Signed)
-----   Message from Jerene BearsMary S Miller, MD sent at 04/08/2016  5:47 PM EDT ----- Please inform pt pap smear was normal.  Needs repeat pap and HR HPV in six months.  06 recall.  Remove from current recall.

## 2016-04-11 ENCOUNTER — Ambulatory Visit (INDEPENDENT_AMBULATORY_CARE_PROVIDER_SITE_OTHER): Payer: Self-pay

## 2016-04-11 DIAGNOSIS — R3129 Other microscopic hematuria: Secondary | ICD-10-CM

## 2016-04-11 NOTE — Progress Notes (Signed)
Patient here to leave urine sample for urine micro and culture. Patient left before leaving a sample.  Patient states she feels good today.  Routed to provider and encounter closed.

## 2016-04-12 LAB — URINALYSIS, MICROSCOPIC ONLY
Bacteria, UA: NONE SEEN [HPF]
Casts: NONE SEEN [LPF]
Crystals: NONE SEEN [HPF]
RBC / HPF: NONE SEEN RBC/HPF (ref <=2)
Yeast: NONE SEEN [HPF]

## 2016-04-13 LAB — URINE CULTURE

## 2016-08-04 ENCOUNTER — Other Ambulatory Visit: Payer: Self-pay | Admitting: Family Medicine

## 2016-10-09 ENCOUNTER — Encounter: Payer: Self-pay | Admitting: Family Medicine

## 2016-10-10 ENCOUNTER — Ambulatory Visit: Payer: Self-pay | Admitting: Obstetrics & Gynecology

## 2016-10-20 ENCOUNTER — Telehealth: Payer: Self-pay | Admitting: Obstetrics & Gynecology

## 2016-10-20 ENCOUNTER — Ambulatory Visit: Payer: Self-pay | Admitting: Obstetrics & Gynecology

## 2016-10-20 NOTE — Telephone Encounter (Signed)
Left message on voicemail to call and reschedule cancelled appointment. °

## 2016-10-21 ENCOUNTER — Telehealth: Payer: Self-pay | Admitting: *Deleted

## 2016-10-21 NOTE — Telephone Encounter (Signed)
error 

## 2016-10-29 ENCOUNTER — Encounter: Payer: Self-pay | Admitting: Family Medicine

## 2016-10-29 ENCOUNTER — Ambulatory Visit (INDEPENDENT_AMBULATORY_CARE_PROVIDER_SITE_OTHER): Payer: Self-pay | Admitting: Family Medicine

## 2016-10-29 VITALS — BP 102/82 | HR 82 | Temp 98.7°F | Resp 16 | Wt 169.6 lb

## 2016-10-29 DIAGNOSIS — R300 Dysuria: Secondary | ICD-10-CM

## 2016-10-29 LAB — POC URINALSYSI DIPSTICK (AUTOMATED)
Glucose, UA: NEGATIVE
Nitrite, UA: POSITIVE
Spec Grav, UA: >=1.03 — AB (ref 1.010–1.025)
Urobilinogen, UA: 2 E.U./dL — AB
pH, UA: 5.5 (ref 5.0–8.0)

## 2016-10-29 MED ORDER — CIPROFLOXACIN HCL 500 MG PO TABS: 500.0000 mg | ORAL_TABLET | Freq: Two times a day (BID) | ORAL | 0 refills | Status: DC

## 2016-10-29 NOTE — Patient Instructions (Signed)

## 2016-10-29 NOTE — Addendum Note (Signed)
Addended by: Starla Link on: 10/29/2016 09:46 AM   Modules accepted: Orders

## 2016-10-29 NOTE — Progress Notes (Signed)
Subjective:     Patient ID: Amy Davila, female   DOB: 1955/08/10, 61 y.o.   MRN: [id number]  HPI Patient seen with 4 day history of urine frequency and burning with urination. She is concerned for UTI. No gross hematuria. No fever. No nausea or vomiting. No flank pain. She saw GYN several months ago and had similar symptoms with negative culture then. She has allergies to sulfa and Macrobid  Past Medical History:  Diagnosis Date  . Arthritis   . Depression   . Migraine with aura    Past Surgical History:  Procedure Laterality Date  . ABLATION  07/2003   HTA  . BLADDER SUSPENSION  11/29/09   with pelvic hematoma  . 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 KNEE ARTHROSCOPY Left 07/2003    reports that she has never smoked. She has never used smokeless tobacco. She reports that she drinks about 3.0 - 4.2 oz of alcohol per week . She reports that she does not use drugs. family history includes Arthritis in her father and mother; Cancer in her maternal grandmother; Diabetes in her father; Heart disease in her father; Hypertension in her father, maternal grandmother, and mother. Allergies  Allergen Reactions  . Macrobid [employer] Macro] Other (See Comments)    Itching, hot hands and feet, increased with two doses  . Sulfa Antibiotics     Yeast infection occurs     Review of Systems  Constitutional: Negative for appetite change, chills and fever.  Gastrointestinal: Negative for abdominal pain, constipation, diarrhea, nausea and vomiting.  Genitourinary: Positive for dysuria and frequency. Negative for flank pain and hematuria.  Musculoskeletal: Negative for back pain.  Neurological: Negative for dizziness.       Objective:   Physical Exam  Constitutional: She appears well-developed and well-nourished.  HENT:  Head: Normocephalic and atraumatic.  Cardiovascular: Normal rate, regular rhythm and normal heart sounds.   Pulmonary/Chest: Breath sounds normal.   Abdominal: Soft.       Assessment:     Dysuria. Suspect uncomplicated cystitis based on urine dipstick    Plan:     -urine culture sent -Start Cipro 500 mg twice a day for 5 days -Stay well-hydrated -Follow-up promptly for any fever or worsening symptoms  Kristian Covey MD [facility]

## 2016-10-31 LAB — URINE CULTURE
MICRO NUMBER:: 81129388
SPECIMEN QUALITY:: ADEQUATE

## 2016-11-03 ENCOUNTER — Encounter: Payer: Self-pay | Admitting: Family Medicine

## 2016-11-10 ENCOUNTER — Ambulatory Visit: Payer: Self-pay | Admitting: Obstetrics & Gynecology

## 2016-11-13 ENCOUNTER — Other Ambulatory Visit (HOSPITAL_COMMUNITY)
Admission: RE | Admit: 2016-11-13 | Discharge: 2016-11-13 | Disposition: A | Discharge status: Home / Self Care | Payer: Self-pay | Source: Ambulatory Visit | Attending: Obstetrics & Gynecology | Admitting: Obstetrics & Gynecology

## 2016-11-13 ENCOUNTER — Ambulatory Visit (INDEPENDENT_AMBULATORY_CARE_PROVIDER_SITE_OTHER): Payer: Self-pay | Admitting: Obstetrics & Gynecology

## 2016-11-13 VITALS — BP 106/70 | HR 76 | Resp 16 | Ht 67.0 in | Wt 169.0 lb

## 2016-11-13 DIAGNOSIS — N870 Mild cervical dysplasia: Secondary | ICD-10-CM

## 2016-11-13 DIAGNOSIS — R87810 Cervical high risk human papillomavirus (HPV) DNA test positive: Secondary | ICD-10-CM | POA: Insufficient documentation

## 2016-11-13 NOTE — Progress Notes (Signed)
GYNECOLOGY  VISIT  CC:  Repeat Pap smear  HPI: 61 y.o. G69P2002 Widowed Caucasian female here for 6 month pap.  Pt has ASCUS-H pap smear obtained 09/07/15.  Colposcopy was performed 09/26/16 showing CIN on biopsy and CIN 1 on ECC.  Due to discrepancy in pap and colposcopic findings, LEEP recommended.  This was performed on 10/12/15 and final pathology showed CIN1 with negative margins.  Repeat six month pap smear was performed as no high grade finding was noted.  This was done 04/03/16 and was negative.  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  Likes the work very much.  Not currently SA.  Denies vaginal bleeding or vaginal discharge.    Has worked on diet and exercise since finishing degree in 5/18.  Down about 20 pounds.  Happy with life changes.  GYNECOLOGIC HISTORY: Patient's last menstrual period was 08/20/2008 (approximate). Contraception: post menopausal  Menopausal hormone therapy: none  Patient Active Problem List   Diagnosis Date Noted  . 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 Tibialis tenosynovitis 01/04/2015  . Pain in lower limb 01/04/2015  . Migraine with aura   . Moderate episode of recurrent major depressive disorder (HCC) 01/15/2009  . Avitaminosis D 05/12/2008    Past Medical History:  Diagnosis Date  . Arthritis   . Depression   . Migraine with aura     Past Surgical History:  Procedure Laterality Date  . ABLATION  07/2003   HTA  . BLADDER SUSPENSION  11/29/09   with pelvic hematoma  . 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 KNEE ARTHROSCOPY Left 07/2003    MEDS:   Current Outpatient Prescriptions on File Prior to Visit  Medication Sig Dispense Refill  . buPROPion (WELLBUTRIN XL) 150 MG 24 hr tablet TAKE ONE TABLET BY MOUTH DAILY 30 tablet 4  . calcium-vitamin D (OSCAL WITH D) 500-200 MG-UNIT per tablet Take 1 tablet by mouth daily.       No current facility-administered medications on file prior to visit.      ALLERGIES: Macrobid [nitrofurantoin monohyd macro] and Sulfa antibiotics  Family History  Problem Relation Age of Onset  . Arthritis Mother   . Hypertension Mother   . Arthritis Father   . Hypertension Father   . Diabetes Father        type ll  . Heart disease Father        a fib  . Cancer Maternal Grandmother        lung  . Hypertension Maternal Grandmother   . Breast cancer Neg Hx     SH:  Widowed, non smoker  Review of Systems  All other systems reviewed and are negative.   PHYSICAL EXAMINATION:    BP 106/70 (BP Location: Right Arm, Patient Position: Sitting, Cuff Size: Normal)   Pulse 76   Resp 16   Ht 5\' 7"  (1.702 m)   Wt 169 lb (76.7 kg)   LMP 08/20/2008 (Approximate)   BMI 26.47 kg/m     General appearance: alert, cooperative and appears stated age Abdomen: soft, non-tender; bowel sounds normal; no masses,  no organomegaly  Pelvic: External genitalia:  no lesions              Urethra:  normal appearing urethra with no masses, tenderness or lesions              Bartholins and Skenes: normal  Vagina: normal appearing vagina with normal color and discharge, no lesions              Cervix: no lesions and s/p LEEP              Bimanual Exam:  Uterus:  normal size, contour, position, consistency, mobility, non-tender              Adnexa: no mass, fullness, tenderness              Anus:  no lesions  Chaperone was present for exam.  Assessment: H/O ASCUS H pap smear s/p LEEP 9/17  Plan: Pap and HR HPV pending.  Results and follow up recommendations will be made once results are finalized.

## 2016-11-16 ENCOUNTER — Encounter: Payer: Self-pay | Admitting: Obstetrics & Gynecology

## 2016-11-18 LAB — CYTOLOGY - PAP
Diagnosis: NEGATIVE
HPV 16/18/45 genotyping: NEGATIVE
HPV: DETECTED — AB

## 2016-11-19 ENCOUNTER — Other Ambulatory Visit: Payer: Self-pay | Admitting: *Deleted

## 2016-11-19 DIAGNOSIS — R87810 Cervical high risk human papillomavirus (HPV) DNA test positive: Secondary | ICD-10-CM

## 2016-11-21 ENCOUNTER — Telehealth: Payer: Self-pay | Admitting: Obstetrics & Gynecology

## 2016-11-21 NOTE — Telephone Encounter (Signed)
Left message to convey benefits for procedure appt.

## 2016-11-25 NOTE — Telephone Encounter (Signed)
Patient needs to reschedule her upcoming colposcopy appointment.

## 2016-11-25 NOTE — Telephone Encounter (Signed)
Attempted to reach patient at number provided [phone], there was no answer and recording states that the voicemail box is full and is not accepting new messages at this time. 

## 2016-11-26 NOTE — Telephone Encounter (Addendum)
Attempted to reach patient at number provided [phone], there was no answer and recording states that the voicemail box is full and is not accepting new messages at this time.

## 2016-11-26 NOTE — Telephone Encounter (Signed)
Patient calling to reschedule colpo procedure appointment. She can be reached after 11:00 this morning.

## 2016-12-01 NOTE — Telephone Encounter (Signed)
Attempted to reach patient at number provided [phone], there was no answer and recording states that the voicemail box is full and is not accepting new messages at this time. 

## 2016-12-03 NOTE — Telephone Encounter (Signed)
Dr.Miller I have attempted to reach this patient x2 with no return call. Please advise.

## 2016-12-03 NOTE — Telephone Encounter (Signed)
We need to send a letter.  Can you start one please?  Thanks.

## 2016-12-04 NOTE — Telephone Encounter (Signed)
Letter written and to Dr.Miller for review. 

## 2016-12-10 ENCOUNTER — Telehealth: Payer: Self-pay | Admitting: Obstetrics & Gynecology

## 2016-12-10 NOTE — Telephone Encounter (Signed)
Returned call to patient. No answer, unable to leave message, mailbox full.

## 2016-12-10 NOTE — Telephone Encounter (Signed)
Patient would like to reschedule her colposcopy

## 2016-12-15 NOTE — Telephone Encounter (Signed)
Spoke with patient. Patient request to reschedule colpo on a Friday, alternative Wed or Thursday early morning. Patient declined 12/6 at 10am. Colpo scheduled for 01/02/17 at 10am with Dr. Hyacinth MeekerMiller.   Advised patient Dr. Hyacinth MeekerMiller will review, will return call with any additional recommendations. Patient verbalizes understanding.   Routing to provider for final review. Patient is agreeable to disposition. Will close encounter.

## 2016-12-16 ENCOUNTER — Ambulatory Visit: Payer: Self-pay | Admitting: Obstetrics & Gynecology

## 2017-01-02 ENCOUNTER — Encounter: Payer: Self-pay | Admitting: Obstetrics & Gynecology

## 2017-01-02 ENCOUNTER — Ambulatory Visit (INDEPENDENT_AMBULATORY_CARE_PROVIDER_SITE_OTHER): Payer: Self-pay | Admitting: Obstetrics & Gynecology

## 2017-01-02 ENCOUNTER — Other Ambulatory Visit: Payer: Self-pay

## 2017-01-02 ENCOUNTER — Other Ambulatory Visit: Payer: Self-pay | Admitting: Family Medicine

## 2017-01-02 ENCOUNTER — Encounter: Payer: Self-pay | Admitting: Family Medicine

## 2017-01-02 VITALS — BP 102/70 | HR 88 | Resp 14 | Ht 67.0 in | Wt 168.0 lb

## 2017-01-02 DIAGNOSIS — N898 Other specified noninflammatory disorders of vagina: Secondary | ICD-10-CM

## 2017-01-02 DIAGNOSIS — R87810 Cervical high risk human papillomavirus (HPV) DNA test positive: Secondary | ICD-10-CM

## 2017-01-02 DIAGNOSIS — N870 Mild cervical dysplasia: Secondary | ICD-10-CM

## 2017-01-02 NOTE — Patient Instructions (Signed)

## 2017-01-02 NOTE — Progress Notes (Signed)
61 y.o. Widowed Caucasianfemale here for colposcopy with possible biopsies and/or ECC due to +HR HPV Pap obtained with AEX 11/13/16.  She has ASCUS H pap smear with biopsy that did not show any high grade finding.  LEEP was performed.  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  Follow up 6 month pap smear was negative.  1 year pap was negative but HR HPV was still positive.  16/18/45 testing was negative.  Patient's last menstrual period was 08/20/2008 (approximate).          Sexually active: No.  The current method of family planning is post menopausal status.     Patient has been counseled about results and procedure.  Risks and benefits have bene reviewed including immediate and/or delayed bleeding, infection, cervical scaring from procedure, possibility of needing additional follow up as well as treatment.  rare risks of missing a lesion discussed as well.  All questions answered.  Pt ready to proceed.  BP 102/70 (BP Location: Right Arm, Patient Position: Sitting, Cuff Size: Normal)   Pulse 88   Resp 14   Ht 5\' 7"  (1.702 m)   Wt 168 lb (76.2 kg)   LMP 08/20/2008 (Approximate)   BMI 26.31 kg/m   Physical Exam  Constitutional: She is oriented to person, place, and time. She appears well-developed and well-nourished.  Genitourinary:    Neurological: She is alert and oriented to person, place, and time.  Skin: Skin is warm.  Psychiatric: She has a normal mood and affect.    Speculum placed.  3% acetic acid applied to cervix for >45 seconds.  Cervix visualized with both 7.5X and 15X magnification.  Green filter also used.  Lugols solution was used.  Findings:  No AWE noted with application of acetic acid.  However, decreased staining on small area of vagina noted with application of Lugol's solution.  Biopsy:  Of vaginal lesion.  Remainder of vagina was stained and no other lesions were noted.  ECC:  was performed.  Monsel's was not needed.  Excellent hemostasis was present.  Pt tolerated procedure well  and all instruments were removed.  Findings noted above on picture of cervix.  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

## 2017-01-05 MED ORDER — BUPROPION HCL ER (XL) 150 MG PO TB24: 150.0000 mg | ORAL_TABLET | Freq: Every day | ORAL | 3 refills | Status: DC

## 2017-01-06 ENCOUNTER — Telehealth: Payer: Self-pay | Admitting: *Deleted

## 2017-01-06 NOTE — Telephone Encounter (Signed)
-----   Message from Jerene BearsMary S Miller, MD sent at 01/06/2017 12:41 PM EST ----- Please let pt know her ECC was negative and the area in the vagina that I biopsies (and I think fully removed because it was so small) was grade 1 vaginal dysplasia.  Needs repeat Pap and HR HPV 1 year.  08 recall.

## 2017-01-06 NOTE — Telephone Encounter (Signed)
Patient returning a call from [place]

## 2017-01-06 NOTE — Telephone Encounter (Signed)
LM for pt to call back.  Please route to triage if I am not available.  

## 2017-01-07 NOTE — Telephone Encounter (Signed)
Spoke with patient. Results given. Patient verbalizes understanding. 08 recall placed. Encounter closed.  

## 2017-01-07 NOTE — Telephone Encounter (Signed)
Patient returning a call from Reina °

## 2017-01-07 NOTE — Telephone Encounter (Signed)
Left message to call Latoia Eyster at [phone]. 

## 2017-02-25 ENCOUNTER — Encounter: Payer: Self-pay | Admitting: Family Medicine

## 2017-02-25 ENCOUNTER — Ambulatory Visit (INDEPENDENT_AMBULATORY_CARE_PROVIDER_SITE_OTHER): Payer: Self-pay | Admitting: Family Medicine

## 2017-02-25 VITALS — BP 108/72 | HR 84 | Temp 98.3°F | Ht 67.0 in | Wt 165.0 lb

## 2017-02-25 DIAGNOSIS — R300 Dysuria: Secondary | ICD-10-CM

## 2017-02-25 LAB — POCT URINALYSIS DIPSTICK
Bilirubin, UA: NEGATIVE
Glucose, UA: NEGATIVE
Ketones, UA: NEGATIVE
Nitrite, UA: NEGATIVE
Protein, UA: NEGATIVE
Spec Grav, UA: 1.01 (ref 1.010–1.025)
Urobilinogen, UA: 0.2 E.U./dL
pH, UA: 6.5 (ref 5.0–8.0)

## 2017-02-25 MED ORDER — CEPHALEXIN 500 MG PO CAPS: 500.0000 mg | ORAL_CAPSULE | Freq: Four times a day (QID) | ORAL | 0 refills | Status: DC

## 2017-02-25 NOTE — Patient Instructions (Signed)

## 2017-02-25 NOTE — Progress Notes (Signed)
Subjective:     Patient ID: Amy BatheJanet W Dantes, female   DOB: 08/20/1955, 62 y.o.   MRN: [id number]  HPI Patient seen with urinary symptoms which started a few days ago. She has burning and frequency with urination. No gross hematuria. No fevers or chills. No flank pain. She had similar symptoms last October and grew out culture for Escherichia coli. She has history of intolerance with Macrobid and sulfa medications  Past Medical History:  Diagnosis Date  . Arthritis   . Depression   . Migraine with aura    Past Surgical History:  Procedure Laterality Date  . BLADDER SUSPENSION  11/29/09   with pelvic hematoma  . 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 ENDOMETRIAL ABLATION  07/2003   HTA  . KNEE ARTHROSCOPY Left 07/2003    reports that  has never smoked. she has never used smokeless tobacco. She reports that she drinks about 3.0 - 4.2 oz of alcohol per week. She reports that she does not use drugs. family history includes Arthritis in her father and mother; Cancer in her maternal grandmother; Diabetes in her father; Heart disease in her father; Hypertension in her father, maternal grandmother, and mother. Allergies  Allergen Reactions  . Macrobid [employer][Nitrofurantoin Monohyd Macro] Other (See Comments)    Itching, hot hands and feet, increased with two doses  . Sulfa Antibiotics     Yeast infection occurs     Review of Systems  Constitutional: Negative for chills and fever.  Gastrointestinal: Negative for abdominal pain, nausea and vomiting.  Genitourinary: Positive for dysuria. Negative for hematuria.       Objective:   Physical Exam  Constitutional: She appears well-developed and well-nourished.  Cardiovascular: Normal rate and regular rhythm.  Pulmonary/Chest: Effort normal and breath sounds normal. No respiratory distress. She has no wheezes. She has no rales.       Assessment:     Uncomplicated acute infectious cystitis.  Urine dipstick reveals large leukocytes and positive  blood    Plan:     -Urine culture sent -Increase hydration -Start Keflex 500 mg 3 times a day for 5 days  Kristian CoveyBruce W Terrin Imparato MD [facility]

## 2017-02-27 LAB — URINE CULTURE
MICRO NUMBER:: 90160653
SPECIMEN QUALITY:: ADEQUATE

## 2017-03-24 ENCOUNTER — Other Ambulatory Visit: Payer: Self-pay | Admitting: Family Medicine

## 2017-03-24 DIAGNOSIS — Z139 Encounter for screening, unspecified: Secondary | ICD-10-CM

## 2017-04-14 ENCOUNTER — Ambulatory Visit: Payer: Self-pay

## 2017-04-29 ENCOUNTER — Ambulatory Visit
Admission: RE | Admit: 2017-04-29 | Discharge: 2017-04-29 | Disposition: A | Discharge status: Home / Self Care | Payer: No Typology Code available for payment source | Source: Ambulatory Visit | Attending: Family Medicine | Admitting: Family Medicine

## 2017-04-29 DIAGNOSIS — Z139 Encounter for screening, unspecified: Secondary | ICD-10-CM

## 2017-06-21 ENCOUNTER — Encounter: Payer: Self-pay | Admitting: Family Medicine

## 2017-06-22 ENCOUNTER — Ambulatory Visit: Payer: Self-pay | Admitting: Family Medicine

## 2017-06-22 ENCOUNTER — Encounter: Payer: Self-pay | Admitting: Family Medicine

## 2017-06-22 VITALS — BP 110/70 | HR 87 | Temp 98.1°F | Wt 167.7 lb

## 2017-06-22 DIAGNOSIS — W57XXXA Bitten or stung by nonvenomous insect and other nonvenomous arthropods, initial encounter: Secondary | ICD-10-CM

## 2017-06-22 DIAGNOSIS — S30860A Insect bite (nonvenomous) of lower back and pelvis, initial encounter: Secondary | ICD-10-CM

## 2017-06-22 NOTE — Patient Instructions (Signed)

## 2017-06-22 NOTE — Progress Notes (Signed)
  Subjective:     Patient ID: Amy Davila, female   DOB: 11/02/1955, 62 y.o.   MRN: [id number]  HPI Patient seen following tick bite on her left lower back region which was pulled off yesterday. She thinks this was a small deer tick. She's had some swelling and itching since then. No fever. No arthralgias. No other skin rashes. No recent travels  Past Medical History:  Diagnosis Date  . Arthritis   . Depression   . Migraine with aura    Past Surgical History:  Procedure Laterality Date  . BLADDER SUSPENSION  11/29/09   with pelvic hematoma  . 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 ENDOMETRIAL ABLATION  07/2003   HTA  . KNEE ARTHROSCOPY Left 07/2003    reports that she has never smoked. She has never used smokeless tobacco. She reports that she drinks about 3.0 - 4.2 oz of alcohol per week. She reports that she does not use drugs. family history includes Arthritis in her father and mother; Cancer in her maternal grandmother; Diabetes in her father; Heart disease in her father; Hypertension in her father, maternal grandmother, and mother. Allergies  Allergen Reactions  . Macrobid [employer][Nitrofurantoin Monohyd Macro] Other (See Comments)    Itching, hot hands and feet, increased with two doses  . Sulfa Antibiotics     Yeast infection occurs     Review of Systems  Constitutional: Negative for chills and fever.  Neurological: Negative for headaches.       Objective:   Physical Exam  Constitutional: She appears well-developed and well-nourished.  Cardiovascular: Normal rate and regular rhythm.  Pulmonary/Chest: Effort normal and breath sounds normal.  Skin:  Left lower lumbar area approximately 1 cm area - slightly indurated and nontender and erythematous. Nonfluctuant No visible retained tick parts       Assessment:     Tick bite with local allergic response.    Plan:     -Continue topical hydrocortisone cream -She is aware she may have some local irritation for up to  several weeks but follow-up immediately for any signs of erythema migrans, unexplained fever, increased arthralgias, or other concerns  Kristian CoveyBruce W Burchette MD [facility]

## 2017-08-03 ENCOUNTER — Encounter: Payer: Self-pay | Admitting: Obstetrics & Gynecology

## 2017-08-03 ENCOUNTER — Encounter: Payer: Self-pay | Admitting: Family Medicine

## 2017-08-05 ENCOUNTER — Other Ambulatory Visit: Payer: Self-pay | Admitting: Obstetrics & Gynecology

## 2017-08-05 ENCOUNTER — Telehealth: Payer: Self-pay | Admitting: Obstetrics & Gynecology

## 2017-08-05 MED ORDER — BUPROPION HCL ER (XL) 300 MG PO TB24: 300.0000 mg | ORAL_TABLET | Freq: Every day | ORAL | 1 refills | Status: DC

## 2017-08-05 NOTE — Telephone Encounter (Signed)
Spoke with patient, requesting to schedule AEX.   Last pap 11/13/16, colpo 01/02/17.  AEX scheduled for 12/10/17 at 9:15am. Patient declined earlier appts offered.   Routing to provider for final review. Patient is agreeable to disposition. Will close encounter.

## 2017-08-05 NOTE — Telephone Encounter (Signed)
Patient stated that she had issues with her last pap and thought that she was supposed to be coming in every year now. Patient in 08 recall for November. Patient would like to go ahead and schedule.

## 2017-08-18 ENCOUNTER — Encounter: Payer: Self-pay | Admitting: Obstetrics & Gynecology

## 2017-08-18 ENCOUNTER — Telehealth: Payer: Self-pay | Admitting: Obstetrics & Gynecology

## 2017-08-18 NOTE — Telephone Encounter (Signed)
Message   ----- Message from Mychart, Generic sent at 08/18/2017 1:26 PM EDT -----    Good afternoon Dr. Hyacinth MeekerMiller,    I wanted to let you know I think the Wellbutrin has helped with some of my anxiety issues, but as you predicted, not so much with the nightsweats and hot flashes. I also am still struggling with the depressed mood symptoms and do tend to get teary very easily. While, I am very anxious about potential weight gain on the Citalopram, I do think I may need to take a low dose to see if it helps my symptoms. Are there others that work like the Citalopram without the weight gain? Let me know your thoughts for the best way to proceed. Thanks so much.

## 2017-08-18 NOTE — Telephone Encounter (Signed)
Routing to Dr.Miller for review and advise. 

## 2017-08-19 MED ORDER — FLUOXETINE HCL 10 MG PO CAPS: 10.0000 mg | ORAL_CAPSULE | Freq: Every day | ORAL | 0 refills | Status: DC

## 2017-08-19 NOTE — Telephone Encounter (Signed)
Spoke with patient. Advised of message as seen below from Dr.Miller. Patient verbalizes understanding and would like to start Prozac 10 mg. Rx for Prozac 10 mg daily #30 0RF sent to pharmacy on file. Advised patient to contact the office after 1 month on the medication to provide an update to Dr.Miller. Advised to contact the office with any questions or concerns before then. Patient verbalizes understanding.  Routing to provider for final review. Patient agreeable to disposition. Will close encounter.

## 2017-08-19 NOTE — Telephone Encounter (Signed)
Of all the SSRIs, prozac is the least likely to cause weight gain.  It has a side effect of decreased appetite.  So, if weight gain is the biggest concern, I'd start with 10mg  of prozac.

## 2017-08-28 ENCOUNTER — Encounter: Payer: Self-pay | Admitting: Obstetrics & Gynecology

## 2017-08-28 ENCOUNTER — Telehealth: Payer: Self-pay | Admitting: Obstetrics & Gynecology

## 2017-08-28 ENCOUNTER — Other Ambulatory Visit: Payer: Self-pay

## 2017-08-28 ENCOUNTER — Ambulatory Visit (INDEPENDENT_AMBULATORY_CARE_PROVIDER_SITE_OTHER): Payer: Self-pay | Admitting: Obstetrics & Gynecology

## 2017-08-28 VITALS — BP 106/70 | HR 76 | Temp 98.1°F | Resp 16 | Ht 67.0 in | Wt 167.4 lb

## 2017-08-28 DIAGNOSIS — K5792 Diverticulitis of intestine, part unspecified, without perforation or abscess without bleeding: Secondary | ICD-10-CM

## 2017-08-28 DIAGNOSIS — R1032 Left lower quadrant pain: Secondary | ICD-10-CM

## 2017-08-28 DIAGNOSIS — R82998 Other abnormal findings in urine: Secondary | ICD-10-CM

## 2017-08-28 LAB — CBC
Hematocrit: 38.3 % (ref 34.0–46.6)
Hemoglobin: 13.2 g/dL (ref 11.1–15.9)
MCH: 30.4 pg (ref 26.6–33.0)
MCHC: 34.5 g/dL (ref 31.5–35.7)
MCV: 88 fL (ref 79–97)
Platelets: 343 10*3/uL (ref 150–450)
RBC: 4.34 x10E6/uL (ref 3.77–5.28)
RDW: 14.1 % (ref 12.3–15.4)
WBC: 9.3 10*3/uL (ref 3.4–10.8)

## 2017-08-28 LAB — POCT URINALYSIS DIPSTICK
Bilirubin, UA: NEGATIVE
Glucose, UA: NEGATIVE
Ketones, UA: NEGATIVE
Nitrite, UA: NEGATIVE
Protein, UA: NEGATIVE
Urobilinogen, UA: 0.2 E.U./dL
pH, UA: 5 (ref 5.0–8.0)

## 2017-08-28 MED ORDER — CIPROFLOXACIN HCL 500 MG PO TABS: 500.0000 mg | ORAL_TABLET | Freq: Two times a day (BID) | ORAL | 0 refills | Status: DC

## 2017-08-28 MED ORDER — METRONIDAZOLE 500 MG PO TABS: 500.0000 mg | ORAL_TABLET | Freq: Three times a day (TID) | ORAL | 0 refills | Status: DC

## 2017-08-28 NOTE — Progress Notes (Signed)
Spoke with Lupita Leashonna at Dr. Kenna GilbertMann's office. Was advised Dr. Loreta AveMann is out of the office on 8/12, scheduled for OV on 09/01/17 at 8:45am, arrive at 8:30am. Fax OV notes to [phone].  Scheduled for f/u with Dr. Hyacinth MeekerMiller on 08/31/17 at 2:30pm, patient declined earlier time due to work schedule. Patient verbalizes understanding and is agreeable.

## 2017-08-28 NOTE — Telephone Encounter (Signed)
Return call to patient. She c/o LLQ pain x "a couple of days." Thought initially she was having constipation, but used miralax without relief of symptoms. Tylenol helps with pain.  Denies symptoms of fevers, chills, nausea, vomiting, dysuria, hematuria. Has pain when touching her abdomen, sometimes keeps her from sleeping well for the last two nights.  Office visit today with Dr. Hyacinth MeekerMiller scheduled. Pt advised to seek emergency care if symptoms increase. Pt verbalized understanding of plan of care.  Encounter closed.

## 2017-08-28 NOTE — Progress Notes (Signed)
GYNECOLOGY  VISIT  CC:   LLQ x 2 days  HPI: 62 y.o. G77P2002 Widowed Caucasian female here for LLQ sharp pain on and off x 2 days.  Feels pain started yesterday morning and has graduatlly worsened.  Decided this morning that she probably should be seen.  Wonders if this could be her ovary causing the pain.    She is having regular bowel movements but is feeling some bloating and distension.  Denies fever or back pain.  Energy is decreased and if feeling some overall fatigue/malaise.  Denies vaginal bleeding or discharge.  Denies urinary symptoms--urgency, frequency, dysuria.    Has no nausea.  Has no emesis.  Appetite is somewhat decreased.    Did have a colonoscopy in 5/16 with Dr. Loreta Ave showing both "large and small mouthed diverticula in the colon".  Has never had diverticulitis.  Last pap 11/13/16--neg with +HR HPV but neg 16/18/45 HR HPV testing colposcopy did show vaginal dysplasia, grade 1.  Has follow up scheduled for November.  GYNECOLOGIC HISTORY: Patient's last menstrual period was 08/20/2008 (approximate). Contraception: PMP Menopausal hormone therapy: none  Patient Active Problem List   Diagnosis Date Noted  . 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 Tibialis tenosynovitis 01/04/2015  . Pain in lower limb 01/04/2015  . Migraine with aura   . Moderate episode of recurrent major depressive disorder (HCC) 01/15/2009  . Avitaminosis D 05/12/2008    Past Medical History:  Diagnosis Date  . Arthritis   . Depression   . Migraine with aura     Past Surgical History:  Procedure Laterality Date  . BLADDER SUSPENSION  11/29/09   with pelvic hematoma  . 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 ENDOMETRIAL ABLATION  07/2003   HTA  . KNEE ARTHROSCOPY Left 07/2003    MEDS:   Current Outpatient Medications on File Prior to Visit  Medication Sig Dispense Refill  . buPROPion (WELLBUTRIN XL) 300 MG 24 hr tablet Take 1 tablet (300 mg total) by mouth daily. 90  tablet 1  . calcium-vitamin D (OSCAL WITH D) 500-200 MG-UNIT per tablet Take 1 tablet by mouth daily.      Marland Kitchen FLUoxetine (PROZAC) 10 MG capsule Take 1 capsule (10 mg total) by mouth daily. 30 capsule 0  . Multiple Vitamin (MULTIVITAMIN) tablet Take 1 tablet by mouth daily.     No current facility-administered medications on file prior to visit.     ALLERGIES: Macrobid [nitrofurantoin monohyd macro] and Sulfa antibiotics  Family History  Problem Relation Age of Onset  . Arthritis Mother   . Hypertension Mother   . Arthritis Father   . Hypertension Father   . Diabetes Father        type ll  . Heart disease Father        a fib  . Cancer Maternal Grandmother        lung  . Hypertension Maternal Grandmother   . Breast cancer Neg Hx     SH:  Widowed, non smoker  Review of Systems  Gastrointestinal: Positive for abdominal pain.       Bloating   All other systems reviewed and are negative.   PHYSICAL EXAMINATION:    BP 106/70 (BP Location: Right Arm, Patient Position: Sitting, Cuff Size: Large)   Pulse 76   Temp 98.1 F (36.7 C) (Oral)   Resp 16   Ht 5\' 7"  (1.702 m)   Wt 167 lb 6.4 oz (75.9 kg)  LMP 08/20/2008 (Approximate)   BMI 26.22 kg/m     General appearance: alert, cooperative and appears stated age CV:  Regular rate and rhythm Lungs:  clear to auscultation, no wheezes, rales or rhonchi, symmetric air entry Abdomen: soft, guarding in LLQ, tender to superficial and deep palpation in LLQ, no rebound, normal bowel sounds noted, no masses, or organomegaly.  Lymph:  no inguinal LAD noted  Pelvic: External genitalia:  no lesions              Urethra:  normal appearing urethra with no masses, tenderness or lesions              Bartholins and Skenes: normal                 Vagina: normal appearing vagina with normal color and discharge, no lesions              Cervix: no lesions              Bimanual Exam:  Uterus:  normal size, contour, position, consistency, mobility,  non-tender              Adnexa: no mass, fullness, tenderness              Anus:  normal sphincter tone, no lesions  Chaperone was present for exam.  Assessment: LLQ pain c/w diverticulitis H/O diverticulosis on last colonoscopy Abnormal urine test  Plan: Flagyl 500mg  TID x 10 days and ciprofloxin 500mg  bid x 10 days sent to pharmacy on file Stat CBC to check WBC ct obtained Did call Dr. Loreta AveMann and spoke with her regarding management and follow-up.  She will see her next week.  No CT needed per Dr. Kenna GilbertMann's recommendations due to her colonoscopy showing diverticulosis.  Up to Date was reviewed as well and I feel pt meets criteria for outpatient management.   CBC, stat Urine culture obtained as well today.  Cipro will likely treat this if it is abnormal. Precautions regarding reasons to go to the ER reviewed.  Pt voiced clear understanding.   ~25 minutes spent with patient >50% of time was in face to face discussion of above.  Addendum:  CBC was normal with normal WBC count.  Pt was notified.

## 2017-08-28 NOTE — Telephone Encounter (Signed)
Patient called and said she has increasing pain in her lower left abdomin.  Last seen: 01/02/17

## 2017-08-29 LAB — URINE CULTURE

## 2017-08-31 ENCOUNTER — Ambulatory Visit: Payer: Self-pay | Admitting: Obstetrics & Gynecology

## 2017-08-31 ENCOUNTER — Telehealth: Payer: Self-pay | Admitting: *Deleted

## 2017-08-31 NOTE — Telephone Encounter (Signed)
Okay to cancel appointment with Dr. Hyacinth MeekerMiller for today after reviewing with Dr. Hyacinth MeekerMiller.   Encounter closed.

## 2017-08-31 NOTE — Telephone Encounter (Signed)
Patient has an appointment with Dr Hyacinth MeekerMiller this afternoon and also Dr Loreta AveMann. Should she keep appointment here for follow up or just see Dr Loreta AveMann?

## 2017-08-31 NOTE — Telephone Encounter (Signed)
Spoke with patient. She is feeling "so much better." Eating and drinking well, yesterday normal BM.  Has appointment with Dr. Loreta AveMann tomorrow morning at 0830, wants to discuss with Dr. Loreta AveMann about further GI work up.  Unsure if she still needs to see Dr. Hyacinth MeekerMiller.  Advised will discuss with Dr. Hyacinth MeekerMiller and return call if needs to keep appointment for today as scheduled.

## 2017-09-08 LAB — URINE CULTURE

## 2017-09-14 ENCOUNTER — Other Ambulatory Visit: Payer: Self-pay | Admitting: Obstetrics & Gynecology

## 2017-09-14 NOTE — Telephone Encounter (Signed)
Detailed message left on mobile number per DPR for patient to return call to give 1 month update on prozac. Advised could ask to speak to [place].

## 2017-09-14 NOTE — Telephone Encounter (Signed)
Patient returned call. Patient states she felt "the prozac didn't make a difference and I was having trouble sleeping." Patient states she weaned herself by taking every other day and has now stopped. No side effects of stopping and patient states she is sleeping better. Still having night sweats, but states "I want to see how I do before I try anything else." RN advised would refuse refill from pharmacy and update Dr. Hyacinth MeekerMiller. Patient agreeable.   Routing to provider for final review. Patient agreeable to disposition. Will close encounter.

## 2017-12-01 NOTE — Progress Notes (Signed)
62 y.o. G71P2002 Widowed White or Caucasian female here for annual exam.  Doing well.  Work is good.  Sleeping better.   Had diverticulitis this year.     Denies vaginal bleeding.    PCP:  Dr. Caryl Never  Patient's last menstrual period was 08/20/2008 (approximate).          Sexually active: Yes.    The current method of family planning is post menopausal status.    Exercising: No.  walks periodically  Smoker:  no  Health Maintenance: Pap:11/13/16 HR HPV+ Pap normal  09/07/15 ASCUS H, 09/27/15 colposcopy- CIN 1, ECC negative, 10/12/15 LEEP- CIN 1, HPV  History of abnormal Pap:  yes MMG:  04/29/17 density B  Bi-rads 1 Neg Colonoscopy: 05/31/14 repeat in 10 years  BMD:   2005 normal TDaP:  02/08/13 Pneumonia vaccine(s):  Never Shingrix:   Completed  Hep C testing: 02/01/16 Neg  Screening Labs: PCP, Hb today: PCP,    reports that she has never smoked. She has never used smokeless tobacco. She reports that she drinks about 5.0 - 7.0 standard drinks of alcohol per week. She reports that she does not use drugs.  Past Medical History:  Diagnosis Date  . Arthritis   . Depression   . Migraine with aura     Past Surgical History:  Procedure Laterality Date  . BLADDER SUSPENSION  11/29/09   with pelvic hematoma  . 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 ENDOMETRIAL ABLATION  07/2003   HTA  . KNEE ARTHROSCOPY Left 07/2003    Current Outpatient Medications  Medication Sig Dispense Refill  . buPROPion (WELLBUTRIN XL) 300 MG 24 hr tablet Take 1 tablet (300 mg total) by mouth daily. 90 tablet 1  . calcium-vitamin D (OSCAL WITH D) 500-200 MG-UNIT per tablet Take 1 tablet by mouth daily.      . ciprofloxacin (CIPRO) 500 MG tablet Take 1 tablet (500 mg total) by mouth 2 (two) times daily. 20 tablet 0  . FLUoxetine (PROZAC) 10 MG capsule Take 1 capsule (10 mg total) by mouth daily. 30 capsule 0  . metroNIDAZOLE (FLAGYL) 500 MG tablet Take 1 tablet (500 mg total) by mouth 3 (three) times daily. 30  tablet 0  . Multiple Vitamin (MULTIVITAMIN) tablet Take 1 tablet by mouth daily.     No current facility-administered medications for this visit.     Family History  Problem Relation Age of Onset  . Arthritis Mother   . Hypertension Mother   . Arthritis Father   . Hypertension Father   . Diabetes Father        type ll  . Heart disease Father        a fib  . Cancer Maternal Grandmother        lung  . Hypertension Maternal Grandmother   . Breast cancer Neg Hx     Review of Systems  Gastrointestinal:       Chanage in stool   Genitourinary:       Vaginal lump  Loss of urin with cough or sneeze    Hematological: Bruises/bleeds easily.    Exam:   LMP 08/20/2008 (Approximate)       Ht Readings from Last 3 Encounters:  08/28/17 5\' 7"  (1.702 m)  02/25/17 5\' 7"  (1.702 m)  01/02/17 5\' 7"  (1.702 m)    General appearance: alert, cooperative and appears stated age Head: Normocephalic, without obvious abnormality, atraumatic Neck: no adenopathy, supple, symmetrical, trachea midline and  thyroid normal to inspection and palpation Lungs: clear to auscultation bilaterally Breasts: normal appearance, no masses or tenderness Heart: regular rate and rhythm Abdomen: soft, non-tender; bowel sounds normal; no masses,  no organomegaly Extremities: extremities normal, atraumatic, no cyanosis or edema Skin: Skin color, texture, turgor normal. No rashes or lesions Lymph nodes: Cervical, supraclavicular, and axillary nodes normal. No abnormal inguinal nodes palpated Neurologic: Grossly normal   Pelvic: External genitalia:  no lesions              Urethra:  normal appearing urethra with no masses, tenderness or lesions              Bartholins and Skenes: normal                 Vagina: normal appearing vagina with normal color and discharge, no lesions              Cervix: no lesions              Pap taken: Yes.   Bimanual Exam:  Uterus:  normal size, contour, position, consistency,  mobility, non-tender              Adnexa: normal adnexa and no mass, fullness, tenderness               Rectovaginal: Confirms               Anus:  normal sphincter tone, no lesions  Chaperone was present for exam.  A:  Well Woman with normal exam PMP, no HRT H/O abnormal pap smear with LEEP 9/17 with CIN 1, VAIN 1 last year Situational anxiety and depression  P:   Mammogram is UTD pap smear and HR HPV obtained today Lab work and vaccines are UTD D/w pt BMD.  She decides to wait until she is 48. RF for bupropion XL 300mg  daily.  #90/4RF. Return annually or prn

## 2017-12-10 ENCOUNTER — Ambulatory Visit (INDEPENDENT_AMBULATORY_CARE_PROVIDER_SITE_OTHER): Payer: Self-pay | Admitting: Obstetrics & Gynecology

## 2017-12-10 ENCOUNTER — Encounter: Payer: Self-pay | Admitting: Obstetrics & Gynecology

## 2017-12-10 ENCOUNTER — Other Ambulatory Visit (HOSPITAL_COMMUNITY)
Admission: RE | Admit: 2017-12-10 | Discharge: 2017-12-10 | Disposition: A | Discharge status: Home / Self Care | Payer: No Typology Code available for payment source | Source: Ambulatory Visit | Attending: Obstetrics & Gynecology | Admitting: Obstetrics & Gynecology

## 2017-12-10 VITALS — BP 115/70 | HR 72 | Resp 16 | Ht 64.5 in | Wt 167.0 lb

## 2017-12-10 DIAGNOSIS — N890 Mild vaginal dysplasia: Secondary | ICD-10-CM

## 2017-12-10 DIAGNOSIS — Z01419 Encounter for gynecological examination (general) (routine) without abnormal findings: Secondary | ICD-10-CM

## 2017-12-10 MED ORDER — BUPROPION HCL ER (XL) 300 MG PO TB24: 300.0000 mg | ORAL_TABLET | Freq: Every day | ORAL | 4 refills | Status: DC

## 2017-12-13 LAB — CYTOLOGY - PAP
Diagnosis: UNDETERMINED — AB
HPV: NOT DETECTED

## 2017-12-16 ENCOUNTER — Other Ambulatory Visit: Payer: Self-pay | Admitting: *Deleted

## 2017-12-16 DIAGNOSIS — R87610 Atypical squamous cells of undetermined significance on cytologic smear of cervix (ASC-US): Secondary | ICD-10-CM

## 2017-12-31 ENCOUNTER — Encounter: Payer: Self-pay | Admitting: Obstetrics & Gynecology

## 2017-12-31 ENCOUNTER — Ambulatory Visit (INDEPENDENT_AMBULATORY_CARE_PROVIDER_SITE_OTHER): Payer: No Typology Code available for payment source | Admitting: Obstetrics & Gynecology

## 2017-12-31 DIAGNOSIS — R87610 Atypical squamous cells of undetermined significance on cytologic smear of cervix (ASC-US): Secondary | ICD-10-CM

## 2017-12-31 MED ORDER — ZOLPIDEM TARTRATE 10 MG PO TABS: 10.0000 mg | ORAL_TABLET | Freq: Every evening | ORAL | 0 refills | Status: DC | PRN

## 2017-12-31 NOTE — Progress Notes (Signed)
62 y.o. 822P2 Widowed Caucasian  female here for colposcopy with possible biopsies and/or ECC due to Pap smear showing ASCUS findings with neg HR HPV obtained 12/10/17.    Pap smear hx and colposcopy hx as follows: 02/13/15 ASCUS H with +HR HPV Pap 02/23/15 colpo with findings suggestive of HR HPV only 09/07/15 pap:  ASCUS H 09/27/15 LEEP CIN 1 only 04/03/16 Pap WNL 11/13/16 Neg with +HR HPV, neg 16/18/45 Colposcopy 01/02/17 ECC negative, vaginal biopsy with VAIN 1  Patient's last menstrual period was 08/20/2008 (approximate).          Sexually active: Yes.    The current method of family planning is post menopausal status.     Patient has been counseled about results and procedure.  Risks and benefits have bene reviewed including immediate and/or delayed bleeding, infection, cervical scaring from procedure, possibility of needing additional follow up as well as treatment.  rare risks of missing a lesion discussed as well.  All questions answered.  Pt ready to proceed.  BP 120/82 (BP Location: Right Arm, Patient Position: Sitting, Cuff Size: Large)   Pulse 88   Resp 16   Ht 5' 4.5" (1.638 m)   Wt 169 lb (76.7 kg)   LMP 08/20/2008 (Approximate)   BMI 28.56 kg/m   Physical Exam  Constitutional: She is oriented to person, place, and time. She appears well-developed.  Genitourinary:    Vagina normal.  There is no rash, tenderness, lesion or injury on the right labia. There is no rash, tenderness, lesion or injury on the left labia.  Lymphadenopathy:       Right: No inguinal adenopathy present.       Left: No inguinal adenopathy present.  Neurological: She is alert and oriented to person, place, and time.  Skin: Skin is warm and dry.  Psychiatric: She has a normal mood and affect.    Speculum placed.  3% acetic acid applied to cervix for >45 seconds.  Cervix visualized with both 7.5X and 15X magnification.  Green filter also used.  Lugols solution was used.  Transition zone is within  cervical canal but can fully be seen.  Findings:  No AWE or abnormal staining with Lugol's solution.  Biopsy:  Not obtained.  ECC:  was performed.  Monsel's was not needed.  Excellent hemostasis was present.  Pt tolerated procedure well and all instruments were removed.  Findings noted above on picture of cervix.  Assessment:  ASCUS pap with neg HR HPV in pt with prior abnormal pap hx and LEEP 2017  Plan:  Pathology results will be called to patient and follow-up planned pending results.

## 2018-05-20 ENCOUNTER — Telehealth: Payer: Self-pay | Admitting: Obstetrics & Gynecology

## 2018-05-20 ENCOUNTER — Encounter: Payer: Self-pay | Admitting: Obstetrics & Gynecology

## 2018-05-20 NOTE — Telephone Encounter (Signed)
Patient sent the following message through MyChart. Routing to triage to assist patient with request.  Good afternoon [name],  I hope you are coping through the "craziness" right now. 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 Folks are struggling and I'm so glad to be a support to them.  That being said, I too am struggling! I stopped taking the Citalopram a few years ago (due to weight gain), and am still taking my 300mg  of generic Welbutrin.   I am finding that I'm lashing out more as this quarantine wears on and am wondering if I should add back the Citalopram. The only way I will know is to do it.   Let me know your thoughts!  Amy Davila

## 2018-05-20 NOTE — Telephone Encounter (Signed)
Routing to Dr.Miller for review and advise. 

## 2018-05-21 ENCOUNTER — Other Ambulatory Visit: Payer: Self-pay | Admitting: Obstetrics & Gynecology

## 2018-05-21 ENCOUNTER — Encounter: Payer: Self-pay | Admitting: Obstetrics & Gynecology

## 2018-05-21 MED ORDER — CITALOPRAM HYDROBROMIDE 20 MG PO TABS: 20.0000 mg | ORAL_TABLET | Freq: Every day | ORAL | 2 refills | Status: DC

## 2018-05-21 NOTE — Telephone Encounter (Signed)
Message sent to pt and rx done for celexa 20mg  daily.  Asked pt to give update in two weeks.  Ok to close encounter.

## 2018-05-24 NOTE — Telephone Encounter (Signed)
Message last read by Amy Davila at 5:21 PM on 05/23/2018.   Encounter closed.

## 2018-06-10 ENCOUNTER — Encounter: Payer: Self-pay | Admitting: Obstetrics & Gynecology

## 2018-06-10 ENCOUNTER — Telehealth: Payer: Self-pay | Admitting: Obstetrics & Gynecology

## 2018-06-10 NOTE — Telephone Encounter (Signed)
Patient sent the following message through MyChart. Routing to triage to assist patient with request.  Good afternoon Dr. Hyacinth Meeker,  I have been taking the 20 mg Celexa for almost 3 weeks and am still experiencing some symptoms. I would like to try the 40mg  and see if that will make a difference.

## 2018-06-10 NOTE — Telephone Encounter (Signed)
Spoke with patient and she states Celexa 20mg  is not quite relieving symptoms. Patient thinks she needs to go up to 40mg . Will discuss with Dr.Miller and give her a call back. Confirmed pharmacy on file. Routing to provider.

## 2018-06-11 MED ORDER — CITALOPRAM HYDROBROMIDE 40 MG PO TABS: 40.0000 mg | ORAL_TABLET | Freq: Every day | ORAL | 2 refills | Status: DC

## 2018-06-11 NOTE — Telephone Encounter (Signed)
Rx has been completed.  Please let her know and ask her to give and update in two to three weeks please.  Thanks.

## 2018-06-11 NOTE — Telephone Encounter (Signed)
Called patient and per DPR, left message stating Rx had been sent in and to call with update in 2-3 weeks.

## 2018-06-29 ENCOUNTER — Encounter: Payer: Self-pay | Admitting: Family Medicine

## 2018-06-29 ENCOUNTER — Telehealth: Payer: Self-pay

## 2018-06-29 ENCOUNTER — Ambulatory Visit (INDEPENDENT_AMBULATORY_CARE_PROVIDER_SITE_OTHER): Payer: No Typology Code available for payment source | Admitting: Family Medicine

## 2018-06-29 ENCOUNTER — Other Ambulatory Visit: Payer: Self-pay

## 2018-06-29 VITALS — BP 124/64 | HR 73 | Temp 98.0°F | Wt 168.6 lb

## 2018-06-29 DIAGNOSIS — R1012 Left upper quadrant pain: Secondary | ICD-10-CM

## 2018-06-29 DIAGNOSIS — R1011 Right upper quadrant pain: Secondary | ICD-10-CM | POA: Diagnosis not present

## 2018-06-29 NOTE — Telephone Encounter (Signed)
Patient is requesting and appointment today (06/29/2018) for abdominal pain that worsens at night.  Called to set up an appointment and had to leave a message.  Patient must be screened. Please schedule this as a Doxy visit if appropriate.   Left message for patient to call back. CRM created.

## 2018-06-29 NOTE — Telephone Encounter (Signed)
Pain is upper left quadrant. Describes pain as similar to indigestion. Pain worsens when laying down at night. States that she has diverticulitis but this does not feel like a flare up.  Complains of diarrhea  No cough, congestion, sore throat, sneezing, nausea, vomiting, rash, unexplained bruising or bleeding, or fever.  Patient has been scheduled for an in office visit at 1:30.  Will send to Dr. Elease Hashimoto as Juluis Rainier.

## 2018-06-29 NOTE — Progress Notes (Signed)
Subjective:     Patient ID: Amy Davila, female   DOB: 12-13-1955, 63 y.o.   MRN: [id number]  HPI   Patient is seen to evaluate upper abdominal pain.  Duration about 1 week.  Symptoms are somewhat bilateral.  Her pain is worse at night.  Appetite is stable.  No dysphagia.  Denies any stool changes.  She has achy pain but no consistent radiation to the back side.  Symptoms somewhat improved following carbonated beverage use.  Only rare nonsteroidal use with Aleve.  No recent melena.  She has history of diverticulitis but states this pain is different.  Her last colonoscopy was about 4 years ago.  She had diverticular changes in the sigmoid colon.  Denies any recent fever, chills, night sweats, cough, pleuritic pain, dyspnea.  No active GERD symptoms No chest pain.  No exertional symptoms.  Past Medical History:  Diagnosis Date  . Arthritis   . Depression   . Migraine with aura    Past Surgical History:  Procedure Laterality Date  . BLADDER SUSPENSION  11/29/09   with pelvic hematoma  . 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 ENDOMETRIAL ABLATION  07/2003   HTA  . KNEE ARTHROSCOPY Left 07/2003    reports that she has never smoked. She has never used smokeless tobacco. She reports current alcohol use of about 5.0 - 7.0 standard drinks of alcohol per week. She reports that she does not use drugs. family history includes Arthritis in her father and mother; Cancer in her maternal grandmother; Diabetes in her father; Heart disease in her father; Hypertension in her father, maternal grandmother, and mother. Allergies  Allergen Reactions  . Macrobid [employer] Macro] Other (See Comments)    Itching, hot hands and feet, increased with two doses  . Sulfa Antibiotics     Yeast infection occurs     Review of Systems  Constitutional: Negative for appetite change, chills, fever and unexpected weight change.  Respiratory: Negative for shortness of breath.   Cardiovascular:  Negative for chest pain.  Gastrointestinal: Positive for abdominal pain. Negative for abdominal distention, blood in stool, nausea and vomiting.  Genitourinary: Negative for dysuria.  Neurological: Negative for dizziness.       Objective:   Physical Exam Constitutional:      Appearance: Normal appearance.  Neck:     Musculoskeletal: Neck supple.  Cardiovascular:     Rate and Rhythm: Normal rate and regular rhythm.  Pulmonary:     Effort: Pulmonary effort is normal.     Breath sounds: Normal breath sounds.  Abdominal:     General: Bowel sounds are normal. There is no distension.     Palpations: Abdomen is soft. There is no mass.     Tenderness: There is no guarding or rebound.     Comments: She does have some mild tenderness in palpating over the epigastric region.  No hepatomegaly or splenomegaly  Neurological:     Mental Status: She is alert.        Assessment:     1 week history of progressive upper abdominal pain.  She does not have any acute abdominal findings on exam at this time.  Denies any red flags such as recent fever, weight loss, change in appetite    Plan:     -Check labs with lipase, CBC, comprehensive metabolic panel -Set up abdominal ultrasound to further assess  Eulas Post MD [facility] at [facility]

## 2018-06-29 NOTE — Progress Notes (Deleted)
ultrasound

## 2018-06-29 NOTE — Patient Instructions (Signed)
Abdominal Pain, Adult  Abdominal pain can be caused by many things. Often, abdominal pain is not serious and it gets better with no treatment or by being treated at home. However, sometimes abdominal pain is serious. Your health care provider will do a medical history and a physical exam to try to determine the cause of your abdominal pain.  Follow these instructions at home:   Take over-the-counter and prescription medicines only as told by your health care provider. Do not take a laxative unless told by your health care provider.   Drink enough fluid to keep your urine clear or pale yellow.   Watch your condition for any changes.   Keep all follow-up visits as told by your health care provider. This is important.  Contact a health care provider if:   Your abdominal pain changes or gets worse.   You are not hungry or you lose weight without trying.   You are constipated or have diarrhea for more than 2-3 days.   You have pain when you urinate or have a bowel movement.   Your abdominal pain wakes you up at night.   Your pain gets worse with meals, after eating, or with certain foods.   You are throwing up and cannot keep anything down.   You have a fever.  Get help right away if:   Your pain does not go away as soon as your health care provider told you to expect.   You cannot stop throwing up.   Your pain is only in areas of the abdomen, such as the right side or the left lower portion of the abdomen.   You have bloody or black stools, or stools that look like tar.   You have severe pain, cramping, or bloating in your abdomen.   You have signs of dehydration, such as:  ? Dark urine, very little urine, or no urine.  ? Cracked lips.  ? Dry mouth.  ? Sunken eyes.  ? Sleepiness.  ? Weakness.  This information is not intended to replace advice given to you by your health care provider. Make sure you discuss any questions you have with your health care provider.  Document Released: 10/16/2004 Document  Revised: 07/27/2015 Document Reviewed: 06/20/2015  Elsevier Interactive Patient Education  2019 [employer]vier Inc[employer]

## 2018-07-06 ENCOUNTER — Encounter: Payer: Self-pay | Admitting: Family Medicine

## 2018-07-19 ENCOUNTER — Other Ambulatory Visit: Payer: No Typology Code available for payment source

## 2018-08-02 ENCOUNTER — Ambulatory Visit
Admission: RE | Admit: 2018-08-02 | Discharge: 2018-08-02 | Disposition: A | Discharge status: Home / Self Care | Payer: No Typology Code available for payment source | Source: Ambulatory Visit | Attending: Family Medicine | Admitting: Family Medicine

## 2018-08-02 DIAGNOSIS — R1011 Right upper quadrant pain: Secondary | ICD-10-CM

## 2018-09-14 ENCOUNTER — Other Ambulatory Visit: Payer: Self-pay | Admitting: Obstetrics & Gynecology

## 2018-09-14 NOTE — Telephone Encounter (Signed)
Medication refill request: Celexa Last AEX:  12/10/2017 SM Next AEX: 12/27/2018 Last MMG (if hormonal medication request): 04/29/2017 BIRADS 1 Negative Density B Refill authorized: Pending authorization. #30 with 1 refill if appropriate. Please advise.

## 2018-12-27 ENCOUNTER — Ambulatory Visit: Payer: Self-pay | Admitting: Obstetrics & Gynecology

## 2019-01-25 DIAGNOSIS — M25511 Pain in right shoulder: Secondary | ICD-10-CM | POA: Diagnosis not present

## 2019-02-01 ENCOUNTER — Encounter: Payer: No Typology Code available for payment source | Admitting: Family Medicine

## 2019-02-05 ENCOUNTER — Other Ambulatory Visit: Payer: Self-pay | Admitting: Obstetrics & Gynecology

## 2019-02-07 NOTE — Telephone Encounter (Signed)
Medication refill request: Wellbutrin  Last AEX:  12-10-17 SM  Next AEX: 02-18-19 Last MMG (if hormonal medication request): n/a Refill authorized: Today, please advise.   Medication pended for #30, 0RF. Please refill if appropriate.

## 2019-02-16 ENCOUNTER — Other Ambulatory Visit: Payer: Self-pay

## 2019-02-18 ENCOUNTER — Encounter: Payer: Self-pay | Admitting: Obstetrics & Gynecology

## 2019-02-18 ENCOUNTER — Other Ambulatory Visit (HOSPITAL_COMMUNITY)
Admission: RE | Admit: 2019-02-18 | Discharge: 2019-02-18 | Disposition: A | Discharge status: Home / Self Care | Payer: BC Managed Care – PPO | Source: Ambulatory Visit | Attending: Obstetrics & Gynecology | Admitting: Obstetrics & Gynecology

## 2019-02-18 ENCOUNTER — Ambulatory Visit (INDEPENDENT_AMBULATORY_CARE_PROVIDER_SITE_OTHER): Payer: BC Managed Care – PPO | Admitting: Obstetrics & Gynecology

## 2019-02-18 ENCOUNTER — Other Ambulatory Visit: Payer: Self-pay

## 2019-02-18 VITALS — BP 110/62 | HR 68 | Temp 97.3°F | Resp 10 | Ht 64.5 in | Wt 172.0 lb

## 2019-02-18 DIAGNOSIS — E2839 Other primary ovarian failure: Secondary | ICD-10-CM | POA: Diagnosis not present

## 2019-02-18 DIAGNOSIS — N870 Mild cervical dysplasia: Secondary | ICD-10-CM

## 2019-02-18 DIAGNOSIS — Z01419 Encounter for gynecological examination (general) (routine) without abnormal findings: Secondary | ICD-10-CM

## 2019-02-18 DIAGNOSIS — Z124 Encounter for screening for malignant neoplasm of cervix: Secondary | ICD-10-CM | POA: Insufficient documentation

## 2019-02-18 DIAGNOSIS — N766 Ulceration of vulva: Secondary | ICD-10-CM | POA: Diagnosis not present

## 2019-02-18 MED ORDER — BUPROPION HCL ER (XL) 300 MG PO TB24: 300.0000 mg | ORAL_TABLET | Freq: Every day | ORAL | 4 refills | Status: DC

## 2019-02-18 MED ORDER — CITALOPRAM HYDROBROMIDE 40 MG PO TABS: 40.0000 mg | ORAL_TABLET | Freq: Every day | ORAL | 4 refills | Status: DC

## 2019-02-18 NOTE — Progress Notes (Signed)
64 y.o. [id number] Domestic Partner White or Caucasian female here for annual exam.  Doing well.  Denies vaginal bleeding.  Is working from home.    Has appt and blood work scheduled.    She has started to have some fecal incontinence.  Feels urgency and then can't hold.  Stool is very soft and less formed.  Patient's last menstrual period was 08/20/2008 (approximate).          Sexually active: Yes.    The current method of family planning is post menopausal status.    Exercising: Yes.    walking Smoker:  no  Health Maintenance: Pap:  12/31/17 ECC Negative  12/10/17 ASCUS:Neg HR HPV  11/13/16 HR HPV+ Pap normal    09/07/15 ASCUS H,   09/27/15 colposcopy- CIN 1, ECC negative  10/12/15 LEEP- CIN 1, HPV History of abnormal Pap:  yes MMG:  04/29/17 BIRADS 1 negative/density b.  She is aware this is due.   Colonoscopy:  05/31/14 f/u 10 years BMD:   2005 normal TDaP:  02/08/13 Pneumonia vaccine(s):  n/a Shingrix:   Completed Hep C testing: 02/01/16 Neg Screening Labs: PCP   reports that she has never smoked. She has never used smokeless tobacco. She reports current alcohol use of about 5.0 - 7.0 standard drinks of alcohol per week. She reports that she does not use drugs.  Past Medical History:  Diagnosis Date  . Arthritis   . Depression   . Migraine with aura     Past Surgical History:  Procedure Laterality Date  . BLADDER SUSPENSION  11/29/09   with pelvic hematoma  . 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 ENDOMETRIAL ABLATION  07/2003   HTA  . KNEE ARTHROSCOPY Left 07/2003    Current Outpatient Medications  Medication Sig Dispense Refill  . BLACK COHOSH EXTRACT PO Take by mouth.    Marland Kitchen buPROPion (WELLBUTRIN XL) 300 MG 24 hr tablet TAKE 1 TABLET BY MOUTH DAILY 90 tablet 0  . citalopram (CELEXA) 40 MG tablet TAKE ONE TABLET BY MOUTH DAILY 30 tablet 5  . EVENING PRIMROSE OIL PO Take by mouth.    . Multiple Vitamin (MULTIVITAMIN) tablet Take 1 tablet by mouth daily.     No current  facility-administered medications for this visit.    Family History  Problem Relation Age of Onset  . Arthritis Mother   . Hypertension Mother   . Arthritis Father   . Hypertension Father   . Diabetes Father        type ll  . Heart disease Father        a fib  . Cancer Maternal Grandmother        lung  . Hypertension Maternal Grandmother   . Breast cancer Neg Hx     Review of Systems  All other systems reviewed and are negative.   Exam:   BP 110/62 (BP Location: Right Arm, Patient Position: Sitting, Cuff Size: Normal)   Pulse 68   Temp (!) 97.3 F (36.3 C) (Temporal)   Resp 10   Ht 5' 4.5" (1.638 m)   Wt 172 lb (78 kg)   LMP 08/20/2008 (Approximate)   BMI 29.07 kg/m   Height: 5' 4.5" (163.8 cm)  Ht Readings from Last 3 Encounters:  02/18/19 5' 4.5" (1.638 m)  12/31/17 5' 4.5" (1.638 m)  12/10/17 5' 4.5" (1.638 m)    General appearance: alert, cooperative and appears stated age Head: Normocephalic, without obvious abnormality, atraumatic Neck:  no adenopathy, supple, symmetrical, trachea midline and thyroid normal to inspection and palpation Lungs: clear to auscultation bilaterally Breasts: normal appearance, no masses or tenderness Heart: regular rate and rhythm Abdomen: soft, non-tender; bowel sounds normal; no masses,  no organomegaly Extremities: extremities normal, atraumatic, no cyanosis or edema Skin: Skin color, texture, turgor normal. No rashes or lesions Lymph nodes: Cervical, supraclavicular, and axillary nodes normal. No abnormal inguinal nodes palpated Neurologic: Grossly normal   Pelvic: External genitalia:  Erythematous lesion on left labia minora with minimal ulceratoin              Urethra:  normal appearing urethra with no masses, tenderness or lesions              Bartholins and Skenes: normal                 Vagina: normal appearing vagina with normal color and discharge, no lesions              Cervix: no lesions              Pap taken:  Yes.   Bimanual Exam:  Uterus:  normal size, contour, position, consistency, mobility, non-tender              Adnexa: normal adnexa and no mass, fullness, tenderness               Rectovaginal: Confirms               Anus:  normal sphincter tone (but not very good control with tightening and relaxing muscle), no lesions  Chaperone, Zenovia Jordan, CMA, was present for exam.  A:  Well Woman with normal exam PMP, no HRT H/o abnormal pap with HR HPV 9/17, VAIN 1.  ASCUS pap with neg HR HPV 11/19 Situation anxiety and depression Vulvar erythematous lesion today Fecal incontinence   P:   Mammogram guidelines reviewed.  Pt aware this is due and will call to schedule. BMD order placed today.  Pt will schedule with MMG pap smear and HR HPV obtained today HSV PCR obtained Pelvic PT and metamucil discussed for fecal incontinence.  She will let me know when ready for pelvic PT RF for celexa and Wellbutrin XL 90 day supply with 1 year RF to pharmacy Has appt with Dr. Caryl Never 03/01/2019 and will have lab work done that day. return annually or prn

## 2019-02-20 LAB — HERPES SIMPLEX VIRUS(HSV) DNA BY PCR
HSV 2 DNA: NEGATIVE
HSV-1 DNA: NEGATIVE

## 2019-02-21 LAB — CYTOLOGY - PAP
Comment: NEGATIVE
Diagnosis: UNDETERMINED — AB
High risk HPV: POSITIVE — AB

## 2019-02-22 ENCOUNTER — Other Ambulatory Visit: Payer: Self-pay | Admitting: Obstetrics & Gynecology

## 2019-02-22 DIAGNOSIS — Z1231 Encounter for screening mammogram for malignant neoplasm of breast: Secondary | ICD-10-CM

## 2019-02-24 ENCOUNTER — Telehealth: Payer: Self-pay | Admitting: *Deleted

## 2019-02-24 ENCOUNTER — Telehealth: Payer: Self-pay | Admitting: Obstetrics & Gynecology

## 2019-02-24 DIAGNOSIS — R87610 Atypical squamous cells of undetermined significance on cytologic smear of cervix (ASC-US): Secondary | ICD-10-CM

## 2019-02-24 NOTE — Telephone Encounter (Signed)
Leda Min, RN  02/24/2019 10:28 AM EST    Left message to call Noreene Larsson, RN at [facility] [phone].   ASCUS pap, positive HPV

## 2019-02-24 NOTE — Telephone Encounter (Signed)
Spoke with patient, advised of PAP results as seen below. Patient agreeable to proceed with colpo. Patient is postmenopausal. Patient request to schedule on a Friday. Colpo scheduled for 2/5 at 2:30pm with Dr. Hyacinth Meeker. Advised patient to take Motrin 800 mg with food and water one hour before procedure. Order placed for precert.   Routing to provider for final review. Patient is agreeable to disposition. Will close encounter.  Cc: Soundra Pilon

## 2019-02-24 NOTE — Telephone Encounter (Signed)
-----   Message from Jerene Bears, MD sent at 02/23/2019  9:31 AM EST ----- Please let pt know her pap showed ASCUS.  She has seen this already.  HR HPV was negative.  Needs colposcopy again.

## 2019-02-24 NOTE — Telephone Encounter (Signed)
Call placed to convey benefits for colposcopy. Spoke with the patient and conveyed the benefits. Patient understands/agreeable with the benefits. Patient is aware of the cancellation policy. Appointment scheduled 02/25/19.

## 2019-02-25 ENCOUNTER — Other Ambulatory Visit: Payer: Self-pay

## 2019-02-25 ENCOUNTER — Other Ambulatory Visit (HOSPITAL_COMMUNITY)
Admission: RE | Admit: 2019-02-25 | Discharge: 2019-02-25 | Disposition: A | Discharge status: Home / Self Care | Payer: BC Managed Care – PPO | Source: Ambulatory Visit | Attending: Obstetrics & Gynecology | Admitting: Obstetrics & Gynecology

## 2019-02-25 ENCOUNTER — Ambulatory Visit (INDEPENDENT_AMBULATORY_CARE_PROVIDER_SITE_OTHER): Payer: BC Managed Care – PPO | Admitting: Obstetrics & Gynecology

## 2019-02-25 ENCOUNTER — Encounter: Payer: Self-pay | Admitting: Obstetrics & Gynecology

## 2019-02-25 DIAGNOSIS — R87810 Cervical high risk human papillomavirus (HPV) DNA test positive: Secondary | ICD-10-CM

## 2019-02-25 DIAGNOSIS — R87610 Atypical squamous cells of undetermined significance on cytologic smear of cervix (ASC-US): Secondary | ICD-10-CM | POA: Insufficient documentation

## 2019-02-25 DIAGNOSIS — N890 Mild vaginal dysplasia: Secondary | ICD-10-CM | POA: Diagnosis not present

## 2019-02-25 NOTE — Patient Instructions (Signed)

## 2019-02-28 ENCOUNTER — Other Ambulatory Visit: Payer: Self-pay

## 2019-03-01 ENCOUNTER — Other Ambulatory Visit: Payer: Self-pay

## 2019-03-01 ENCOUNTER — Encounter: Payer: Self-pay | Admitting: Family Medicine

## 2019-03-01 ENCOUNTER — Ambulatory Visit (INDEPENDENT_AMBULATORY_CARE_PROVIDER_SITE_OTHER): Payer: BC Managed Care – PPO | Admitting: Family Medicine

## 2019-03-01 ENCOUNTER — Telehealth: Payer: Self-pay | Admitting: Obstetrics & Gynecology

## 2019-03-01 ENCOUNTER — Encounter: Payer: Self-pay | Admitting: Obstetrics & Gynecology

## 2019-03-01 VITALS — BP 120/72 | HR 58 | Temp 97.5°F | Ht 65.0 in | Wt 173.5 lb

## 2019-03-01 DIAGNOSIS — Z0000 Encounter for general adult medical examination without abnormal findings: Secondary | ICD-10-CM | POA: Diagnosis not present

## 2019-03-01 LAB — CBC WITH DIFFERENTIAL/PLATELET
Basophils Absolute: 0.1 10*3/uL (ref 0.0–0.1)
Basophils Relative: 1.7 % (ref 0.0–3.0)
Eosinophils Absolute: 0.3 10*3/uL (ref 0.0–0.7)
Eosinophils Relative: 4.7 % (ref 0.0–5.0)
HCT: 42.4 % (ref 36.0–46.0)
Hemoglobin: 14.3 g/dL (ref 12.0–15.0)
Lymphocytes Relative: 32.3 % (ref 12.0–46.0)
Lymphs Abs: 2 10*3/uL (ref 0.7–4.0)
MCHC: 33.7 g/dL (ref 30.0–36.0)
MCV: 94.9 fl (ref 78.0–100.0)
Monocytes Absolute: 0.5 10*3/uL (ref 0.1–1.0)
Monocytes Relative: 8.6 % (ref 3.0–12.0)
Neutro Abs: 3.3 10*3/uL (ref 1.4–7.7)
Neutrophils Relative %: 52.7 % (ref 43.0–77.0)
Platelets: 292 10*3/uL (ref 150.0–400.0)
RBC: 4.47 Mil/uL (ref 3.87–5.11)
RDW: 13.9 % (ref 11.5–15.5)
WBC: 6.3 10*3/uL (ref 4.0–10.5)

## 2019-03-01 LAB — BASIC METABOLIC PANEL
BUN: 16 mg/dL (ref 6–23)
CO2: 28 mEq/L (ref 19–32)
Calcium: 9.3 mg/dL (ref 8.4–10.5)
Chloride: 102 mEq/L (ref 96–112)
Creatinine, Ser: 0.71 mg/dL (ref 0.40–1.20)
GFR: 83.45 mL/min (ref >60.00)
Glucose, Bld: 72 mg/dL (ref 70–99)
Potassium: 4.4 mEq/L (ref 3.5–5.1)
Sodium: 138 mEq/L (ref 135–145)

## 2019-03-01 LAB — HEPATIC FUNCTION PANEL
ALT: 17 U/L (ref 0–35)
AST: 18 U/L (ref 0–37)
Albumin: 4.3 g/dL (ref 3.5–5.2)
Alkaline Phosphatase: 69 U/L (ref 39–117)
Bilirubin, Direct: 0.1 mg/dL (ref 0.0–0.3)
Total Bilirubin: 0.7 mg/dL (ref 0.2–1.2)
Total Protein: 6.8 g/dL (ref 6.0–8.3)

## 2019-03-01 LAB — LIPID PANEL
Cholesterol: 191 mg/dL (ref 0–200)
HDL: 102.6 mg/dL (ref >39.00)
LDL Cholesterol: 76 mg/dL (ref 0–99)
NonHDL: 88.33
Total CHOL/HDL Ratio: 2
Triglycerides: 61 mg/dL (ref 0.0–149.0)
VLDL: 12.2 mg/dL (ref 0.0–40.0)

## 2019-03-01 LAB — TSH: TSH: 2.4 u[IU]/mL (ref 0.35–4.50)

## 2019-03-01 LAB — SURGICAL PATHOLOGY

## 2019-03-01 NOTE — Progress Notes (Signed)
Subjective:     Patient ID: Amy Davila, female   DOB: [birth year], 64 y.o.   MRN: [id number]  HPI Amy Davila is seen for physical exam.  She had recent GYN follow-up and had abnormal Pap smear.  She has decided to go ahead and get total hysterectomy.    She has history of migraine headaches which have been recently stable.  She has history of some recurrent depression which is currently stable on Celexa and Wellbutrin.  She has had her Covid vaccines already and also had Shingrix vaccine earlier this year.  Her tetanus is up-to-date.  She had flu vaccine.  Colonoscopy up-to-date.  She has mammogram scheduled for the spring.  She been trying to restrict her high glycemic foods and is walking more for exercise.  Wt Readings from Last 3 Encounters:  03/01/19 173 lb 8 oz (78.7 kg)  02/25/19 174 lb (78.9 kg)  02/18/19 172 lb (78 kg)   Past Medical History:  Diagnosis Date  . Arthritis   . Depression   . Migraine with aura    Past Surgical History:  Procedure Laterality Date  . BLADDER SUSPENSION  11/29/09   with pelvic hematoma  . 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 ENDOMETRIAL ABLATION  07/2003   HTA  . KNEE ARTHROSCOPY Left 07/2003    reports that she has never smoked. She has never used smokeless tobacco. She reports current alcohol use of about 5.0 - 7.0 standard drinks of alcohol per week. She reports that she does not use drugs. family history includes Arthritis in her father and mother; Cancer in her maternal grandmother; Diabetes in her father; Heart disease in her father; Hypertension in her father, maternal grandmother, and mother. Allergies  Allergen Reactions  . Macrobid [employer] Macro] Other (See Comments)    Itching, hot hands and feet, increased with two doses  . Sulfa Antibiotics     Yeast infection occurs      Review of Systems  Constitutional: Negative for activity change, appetite change, fatigue, fever and unexpected weight change.  HENT:  Negative for ear pain, hearing loss, sore throat and trouble swallowing.   Eyes: Negative for visual disturbance.  Respiratory: Negative for cough and shortness of breath.   Cardiovascular: Negative for chest pain and palpitations.  Gastrointestinal: Negative for abdominal pain, blood in stool, constipation and diarrhea.  Genitourinary: Negative for dysuria and hematuria.  Musculoskeletal: Negative for arthralgias, back pain and myalgias.  Skin: Negative for rash.  Neurological: Negative for dizziness, syncope and headaches.  Hematological: Negative for adenopathy.  Psychiatric/Behavioral: Negative for confusion and dysphoric mood.       Objective:   Physical Exam Constitutional:      Appearance: She is well-developed.  HENT:     Head: Normocephalic and atraumatic.  Eyes:     Pupils: Pupils are equal, round, and reactive to light.  Neck:     Thyroid: No thyromegaly.  Cardiovascular:     Rate and Rhythm: Normal rate and regular rhythm.     Heart sounds: Normal heart sounds. No murmur.  Pulmonary:     Effort: No respiratory distress.     Breath sounds: Normal breath sounds. No wheezing or rales.  Abdominal:     General: Bowel sounds are normal. There is no distension.     Palpations: Abdomen is soft.     Tenderness: There is no abdominal tenderness. There is no guarding or rebound.     Comments: Small approximately 1  x 1 cm mobile nontender fatty consistency subcutaneous mass upper mid abdomen.  Genitourinary:    Comments: Per gyn Musculoskeletal:        General: Normal range of motion.     Cervical back: Normal range of motion and neck supple.  Lymphadenopathy:     Cervical: No cervical adenopathy.  Skin:    Findings: No rash.  Neurological:     Mental Status: She is alert and oriented to person, place, and time.     Cranial Nerves: No cranial nerve deficit.     Deep Tendon Reflexes: Reflexes normal.  Psychiatric:        Behavior: Behavior normal.        Thought  Content: Thought content normal.        Judgment: Judgment normal.        Assessment:     Physical exam.  She is getting GYN follow-ups as above.  She has history of recurrent depression currently stable on combination therapy with Wellbutrin citalopram.  Immunizations up-to-date    Plan:     -Obtain screening lab work -Continue regular exercise habits and restriction of high glycemic foods -She is in process of getting scheduled for total hysterectomy  Kristian Covey MD [facility] at [facility]

## 2019-03-01 NOTE — Telephone Encounter (Signed)
Routing MyChart message to Dr. Hyacinth Meeker to review results and advise.

## 2019-03-01 NOTE — Telephone Encounter (Signed)
Visit Follow-Up Question Received: Today Message Contents  Inita, Uram sent to [facility] Clinical [facility]  Phone Number: [phone]  A. VAGINAL, LEFT SIDE WALL, BIOPSY:  - Low grade squamous intraepithelial lesion (VaIN1, mild dysplasia)  - See comment  Good afternoon Dr. Hyacinth Meeker,  With the above test result, what is the plan to address this? Is this something that can be removed when I have the hysterectomy?  I would love your thoughts.

## 2019-03-01 NOTE — Patient Instructions (Signed)
Preventive Care 44-62 Years Old, Female Preventive care refers to visits with your health care provider and lifestyle choices that can promote health and wellness. This includes:  A yearly physical exam. This may also be called an annual well check.  Regular dental visits and eye exams.  Immunizations.  Screening for certain conditions.  Healthy lifestyle choices, such as eating a healthy diet, getting regular exercise, not using drugs or products that contain nicotine and tobacco, and limiting alcohol use. What can I expect for my preventive care visit? Physical exam Your health care provider will check your:  Height and weight. This may be used to calculate body mass index (BMI), which tells if you are at a healthy weight.  Heart rate and blood pressure.  Skin for abnormal spots. Counseling Your health care provider may ask you questions about your:  Alcohol, tobacco, and drug use.  Emotional well-being.  Home and relationship well-being.  Sexual activity.  Eating habits.  Work and work Statistician.  Method of birth control.  Menstrual cycle.  Pregnancy history. What immunizations do I need?  Influenza (flu) vaccine  This is recommended every year. Tetanus, diphtheria, and pertussis (Tdap) vaccine  You may need a Td booster every 10 years. Varicella (chickenpox) vaccine  You may need this if you have not been vaccinated. Zoster (shingles) vaccine  You may need this after age 43. Measles, mumps, and rubella (MMR) vaccine  You may need at least one dose of MMR if you were born in 1957 or later. You may also need a second dose. Pneumococcal conjugate (PCV13) vaccine  You may need this if you have certain conditions and were not previously vaccinated. Pneumococcal polysaccharide (PPSV23) vaccine  You may need one or two doses if you smoke cigarettes or if you have certain conditions. Meningococcal conjugate (MenACWY) vaccine  You may need this if you  have certain conditions. Hepatitis A vaccine  You may need this if you have certain conditions or if you travel or work in places where you may be exposed to hepatitis A. Hepatitis B vaccine  You may need this if you have certain conditions or if you travel or work in places where you may be exposed to hepatitis B. Haemophilus influenzae type b (Hib) vaccine  You may need this if you have certain conditions. Human papillomavirus (HPV) vaccine  If recommended by your health care provider, you may need three doses over 6 months. You may receive vaccines as individual doses or as more than one vaccine together in one shot (combination vaccines). Talk with your health care provider about the risks and benefits of combination vaccines. What tests do I need? Blood tests  Lipid and cholesterol levels. These may be checked every 5 years, or more frequently if you are over 14 years old.  Hepatitis C test.  Hepatitis B test. Screening  Lung cancer screening. You may have this screening every year starting at age 42 if you have a 30-pack-year history of smoking and currently smoke or have quit within the past 15 years.  Colorectal cancer screening. All adults should have this screening starting at age 89 and continuing until age 70. Your health care provider may recommend screening at age 47 if you are at increased risk. You will have tests every 1-10 years, depending on your results and the type of screening test.  Diabetes screening. This is done by checking your blood sugar (glucose) after you have not eaten for a while (fasting). You may have this  done every 1-3 years.  Mammogram. This may be done every 1-2 years. Talk with your health care provider about when you should start having regular mammograms. This may depend on whether you have a family history of breast cancer.  BRCA-related cancer screening. This may be done if you have a family history of breast, ovarian, tubal, or peritoneal  cancers.  Pelvic exam and Pap test. This may be done every 3 years starting at age 9. Starting at age 61, this may be done every 5 years if you have a Pap test in combination with an HPV test. Other tests  Sexually transmitted disease (STD) testing.  Bone density scan. This is done to screen for osteoporosis. You may have this scan if you are at high risk for osteoporosis. Follow these instructions at home: Eating and drinking  Eat a diet that includes fresh fruits and vegetables, whole grains, lean protein, and low-fat dairy.  Take vitamin and mineral supplements as recommended by your health care provider.  Do not drink alcohol if: ? Your health care provider tells you not to drink. ? You are pregnant, may be pregnant, or are planning to become pregnant.  If you drink alcohol: ? Limit how much you have to 0-1 drink a day. ? Be aware of how much alcohol is in your drink. In the [place] one drink equals one 12 oz bottle of beer (355 mL), one 5 oz glass of wine (148 mL), or one 1 oz glass of hard liquor (44 mL). Lifestyle  Take daily care of your teeth and gums.  Stay active. Exercise for at least 30 minutes on 5 or more days each week.  Do not use any products that contain nicotine or tobacco, such as cigarettes, e-cigarettes, and chewing tobacco. If you need help quitting, ask your health care provider.  If you are sexually active, practice safe sex. Use a condom or other form of birth control (contraception) in order to prevent pregnancy and STIs (sexually transmitted infections).  If told by your health care provider, take low-dose aspirin daily starting at age 39. What's next?  Visit your health care provider once a year for a well check visit.  Ask your health care provider how often you should have your eyes and teeth checked.  Stay up to date on all vaccines. This information is not intended to replace advice given to you by your health care provider. Make sure you  discuss any questions you have with your health care provider. Document Revised: 09/17/2017 Document Reviewed: 09/17/2017 Elsevier Patient Education  2020 [employer].

## 2019-03-03 NOTE — Progress Notes (Signed)
64 y.o. Domestic Partner Not Hispanic or Latino female here for colposcopy with possible biopsies and/or ECC due to ASCUS Pap with +HR HPV obtained 02/18/2019.    Patient's last menstrual period was 08/20/2008 (approximate).          Sexually active: Yes.    The current method of family planning is post menopausal status.     Patient has been counseled about results and procedure.  Risks and benefits have bene reviewed including immediate and/or delayed bleeding, infection, cervical scaring from procedure, possibility of needing additional follow up as well as treatment.  rare risks of missing a lesion discussed as well.  All questions answered.  Pt ready to proceed.  BP 110/60 (BP Location: Right Arm, Patient Position: Sitting, Cuff Size: Normal)   Pulse 80   Temp (!) 97.5 F (36.4 C) (Temporal)   Resp 10   Ht 5' 4.5" (1.638 m)   Wt 174 lb (78.9 kg)   LMP 08/20/2008 (Approximate)   BMI 29.41 kg/m   Physical Exam  Speculum placed.  3% acetic acid applied to cervix for >45 seconds.  Cervix visualized with both 7.5X and 15X magnification.  Green filter also used.  Lugols solution was used.  Findings:  Decreased staining on left vaginal sidewall just lateral to cervix.  Biopsy:  Obtained at left vaginal sidewall.  ECC:  was performed.  Monsel's was needed.  Excellent hemostasis was present.  Pt tolerated procedure well and all instruments were removed.  Findings noted above on picture of cervix.  Chaperone, Zenovia Jordan, CMA, was present during procedure.  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

## 2019-03-14 ENCOUNTER — Telehealth: Payer: Self-pay | Admitting: Obstetrics & Gynecology

## 2019-03-14 ENCOUNTER — Encounter: Payer: Self-pay | Admitting: Obstetrics & Gynecology

## 2019-03-14 NOTE — Telephone Encounter (Signed)
Routing to Dr. Miller

## 2019-03-14 NOTE — Telephone Encounter (Signed)
Patient sent the following message through MyChart. Routing to triage to assist patient with request.  [facility]  Phone Number: [phone]  Good afternoon Dr. Hyacinth Meeker,  I received your voice mail and am anxious to discuss my options. I am available all afternoon until 5:15 and again after 6:15 tonight. I am also available all of tomorrow after lunch. I am also available after lunch on Wednesday. I look forward to discussing with you.  Amy Davila

## 2019-03-15 NOTE — Telephone Encounter (Signed)
Second triage message is open and I have called pt today.  This encounter close and will document in second open encounter.

## 2019-03-15 NOTE — Telephone Encounter (Signed)
Encounter closed

## 2019-03-16 NOTE — Telephone Encounter (Signed)
Spoke with pt yesterday at around 5:45pm.  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  Treatments with laser ablative therapy, 5-FU and Aldara per guidelines discussed however this is typically done with high grade vaginal dysplasia.  Also, ECC results discussed showing atypia but not clear low grade changes.  After discussion and questions being answered, she feel comfortable with conservative management and repeating pap with HR HPV 1 year.  She will need appt for one year from last AEX if possible and also new recall.  Current recall can be removed if has not already been removed.  Thanks.

## 2019-03-16 NOTE — Telephone Encounter (Signed)
Spoke with patient.   AEX scheduled for 02/21/20 at 3:30pm. Patient is agreeable to date and time.   Previous recall removed. New 12 recall placed.   Encounter closed.

## 2019-04-15 ENCOUNTER — Ambulatory Visit: Payer: Self-pay | Admitting: Obstetrics & Gynecology

## 2019-04-26 DIAGNOSIS — L814 Other melanin hyperpigmentation: Secondary | ICD-10-CM | POA: Diagnosis not present

## 2019-04-26 DIAGNOSIS — L821 Other seborrheic keratosis: Secondary | ICD-10-CM | POA: Diagnosis not present

## 2019-04-26 DIAGNOSIS — L570 Actinic keratosis: Secondary | ICD-10-CM | POA: Diagnosis not present

## 2019-04-26 DIAGNOSIS — D225 Melanocytic nevi of trunk: Secondary | ICD-10-CM | POA: Diagnosis not present

## 2019-04-26 DIAGNOSIS — D1801 Hemangioma of skin and subcutaneous tissue: Secondary | ICD-10-CM | POA: Diagnosis not present

## 2019-05-12 ENCOUNTER — Ambulatory Visit
Admission: RE | Admit: 2019-05-12 | Discharge: 2019-05-12 | Disposition: A | Discharge status: Home / Self Care | Payer: BC Managed Care – PPO | Source: Ambulatory Visit | Attending: Obstetrics & Gynecology | Admitting: Obstetrics & Gynecology

## 2019-05-12 ENCOUNTER — Other Ambulatory Visit: Payer: Self-pay

## 2019-05-12 DIAGNOSIS — M85851 Other specified disorders of bone density and structure, right thigh: Secondary | ICD-10-CM | POA: Diagnosis not present

## 2019-05-12 DIAGNOSIS — E2839 Other primary ovarian failure: Secondary | ICD-10-CM

## 2019-05-12 DIAGNOSIS — Z780 Asymptomatic menopausal state: Secondary | ICD-10-CM | POA: Diagnosis not present

## 2019-05-12 DIAGNOSIS — Z1231 Encounter for screening mammogram for malignant neoplasm of breast: Secondary | ICD-10-CM

## 2019-06-02 DIAGNOSIS — H524 Presbyopia: Secondary | ICD-10-CM | POA: Diagnosis not present

## 2019-06-02 DIAGNOSIS — H52203 Unspecified astigmatism, bilateral: Secondary | ICD-10-CM | POA: Diagnosis not present

## 2019-06-02 DIAGNOSIS — H5203 Hypermetropia, bilateral: Secondary | ICD-10-CM | POA: Diagnosis not present

## 2019-06-02 DIAGNOSIS — H10413 Chronic giant papillary conjunctivitis, bilateral: Secondary | ICD-10-CM | POA: Diagnosis not present

## 2019-10-20 ENCOUNTER — Encounter: Payer: Self-pay | Admitting: Obstetrics & Gynecology

## 2019-10-21 ENCOUNTER — Telehealth: Payer: Self-pay

## 2019-10-21 NOTE — Telephone Encounter (Signed)
Pt sent following mychart message:   Amy Davila, [place] [facility] Good afternoon Dr. Hyacinth Meeker,  I wanted to reach out to see if it would be possible to schedule my visit before the end of the year. We will be relocating to [facility] in January and I am very concerned about the abnormal Pap smears and the vaginal wall that we were going to revisit in February. If I'm going to need any surgery or treatments I would like for it to be under your care if possible. My schedule is flexible so any appointment time could work for me. Thank you so much.

## 2019-10-21 NOTE — Telephone Encounter (Signed)
Spoke with pt. Pt sent following mychart message. Pt states would like to move up appt for 6 month pap smear instead of waiting til Feb since moving to [facility] in Jan 2022.  Pt scheduled for OV on 11/2 at 1130 am with Dr Hyacinth Meeker. Pt agreeable and verbalized understanding.  Encounter closed.

## 2019-11-01 ENCOUNTER — Other Ambulatory Visit: Payer: Self-pay

## 2019-11-02 ENCOUNTER — Encounter: Payer: Self-pay | Admitting: Family Medicine

## 2019-11-02 ENCOUNTER — Ambulatory Visit: Payer: BC Managed Care – PPO | Admitting: Family Medicine

## 2019-11-02 VITALS — BP 110/80 | HR 60 | Temp 98.4°F | Ht 65.0 in | Wt 175.3 lb

## 2019-11-02 DIAGNOSIS — K5732 Diverticulitis of large intestine without perforation or abscess without bleeding: Secondary | ICD-10-CM

## 2019-11-02 DIAGNOSIS — R1032 Left lower quadrant pain: Secondary | ICD-10-CM | POA: Diagnosis not present

## 2019-11-02 MED ORDER — CIPROFLOXACIN HCL 500 MG PO TABS: 500.0000 mg | ORAL_TABLET | Freq: Two times a day (BID) | ORAL | 0 refills | Status: DC

## 2019-11-02 MED ORDER — METRONIDAZOLE 500 MG PO TABS: 500.0000 mg | ORAL_TABLET | Freq: Three times a day (TID) | ORAL | 0 refills | Status: DC

## 2019-11-02 NOTE — Patient Instructions (Addendum)
Diverticulitis  Diverticulitis is infection or inflammation of small pouches (diverticula) in the colon that form due to a condition called diverticulosis. Diverticula can trap stool (feces) and bacteria, causing infection and inflammation. Diverticulitis may cause severe stomach pain and diarrhea. It may lead to tissue damage in the colon that causes bleeding. The diverticula may also burst (rupture) and cause infected stool to enter other areas of the abdomen. Complications of diverticulitis can include:  Bleeding.  Severe infection.  Severe pain.  Rupture (perforation) of the colon.  Blockage (obstruction) of the colon. What are the causes? This condition is caused by stool becoming trapped in the diverticula, which allows bacteria to grow in the diverticula. This leads to inflammation and infection. What increases the risk? You are more likely to develop this condition if:  You have diverticulosis. The risk for diverticulosis increases if: ? You are overweight or obese. ? You use tobacco products. ? You do not get enough exercise.  You eat a diet that does not include enough fiber. High-fiber foods include fruits, vegetables, beans, nuts, and whole grains. What are the signs or symptoms? Symptoms of this condition may include:  Pain and tenderness in the abdomen. The pain is normally located on the left side of the abdomen, but it may occur in other areas.  Fever and chills.  Bloating.  Cramping.  Nausea.  Vomiting.  Changes in bowel routines.  Blood in your stool. How is this diagnosed? This condition is diagnosed based on:  Your medical history.  A physical exam.  Tests to make sure there is nothing else causing your condition. These tests may include: ? Blood tests. ? Urine tests. ? Imaging tests of the abdomen, including X-rays, ultrasounds, MRIs, or CT scans. How is this treated? Most cases of this condition are mild and can be treated at home.  Treatment may include:  Taking over-the-counter pain medicines.  Following a clear liquid diet.  Taking antibiotic medicines by mouth.  Rest. More severe cases may need to be treated at a hospital. Treatment may include:  Not eating or drinking.  Taking prescription pain medicine.  Receiving antibiotic medicines through an IV tube.  Receiving fluids and nutrition through an IV tube.  Surgery. When your condition is under control, your health care provider may recommend that you have a colonoscopy. This is an exam to look at the entire large intestine. During the exam, a lubricated, bendable tube is inserted into the anus and then passed into the rectum, colon, and other parts of the large intestine. A colonoscopy can show how severe your diverticula are and whether something else may be causing your symptoms. Follow these instructions at home: Medicines  Take over-the-counter and prescription medicines only as told by your health care provider. These include fiber supplements, probiotics, and stool softeners.  If you were prescribed an antibiotic medicine, take it as told by your health care provider. Do not stop taking the antibiotic even if you start to feel better.  Do not drive or use heavy machinery while taking prescription pain medicine. General instructions   Follow a full liquid diet or another diet as directed by your health care provider. After your symptoms improve, your health care provider may tell you to change your diet. He or she may recommend that you eat a diet that contains at least 25 g (25 grams) of fiber daily. Fiber makes it easier to pass stool. Healthy sources of fiber include: ? Berries. One cup contains 4-8 grams of   fiber. ? Beans or lentils. One half cup contains 5-8 grams of fiber. ? Green vegetables. One cup contains 4 grams of fiber.  Exercise for at least 30 minutes, 3 times each week. You should exercise hard enough to raise your heart rate and  break a sweat.  Keep all follow-up visits as told by your health care provider. This is important. You may need a colonoscopy. Contact a health care provider if:  Your pain does not improve.  You have a hard time drinking or eating food.  Your bowel movements do not return to normal. Get help right away if:  Your pain gets worse.  Your symptoms do not get better with treatment.  Your symptoms suddenly get worse.  You have a fever.  You vomit more than one time.  You have stools that are bloody, black, or tarry. Summary  Diverticulitis is infection or inflammation of small pouches (diverticula) in the colon that form due to a condition called diverticulosis. Diverticula can trap stool (feces) and bacteria, causing infection and inflammation.  You are at higher risk for this condition if you have diverticulosis and you eat a diet that does not include enough fiber.  Most cases of this condition are mild and can be treated at home. More severe cases may need to be treated at a hospital.  When your condition is under control, your health care provider may recommend that you have an exam called a colonoscopy. This exam can show how severe your diverticula are and whether something else may be causing your symptoms. This information is not intended to replace advice given to you by your health care provider. Make sure you discuss any questions you have with your health care provider. Document Revised: 12/19/2016 Document Reviewed: 02/09/2016 Elsevier Patient Education  2020 Else[employer] Inc[employer]  

## 2019-11-02 NOTE — Progress Notes (Signed)
Established Patient Office Visit  Subjective:  Patient ID: Amy Davila, female    DOB: 07/13/1955  Age: 64 y.o. MRN: [id number]  CC:  Chief Complaint  Patient presents with  . Diverticulitis    having another flare. Thinks it may be stress of moving to new home    HPI Amy Davila presents for abdominal pain for the past 3 days left lower quadrant.  She has history of sigmoid diverticulosis and has had a few flareups over the years of diverticulitis and the symptoms are exactly the same.  She has not had any nausea or vomiting.  No stool changes.  No fevers or chills.  She is in the process of moving right now and has done a lot of recent lifting and she thinks the stress of moving may be a factor.  Denies any injury.  She has taken Cipro and Flagyl in the past and has done okay with that combination.  She is allergic to sulfa.  No urinary symptoms.  Symptoms are relatively mild at this time.  Past Medical History:  Diagnosis Date  . Arthritis   . Depression   . Migraine with aura     Past Surgical History:  Procedure Laterality Date  . BLADDER SUSPENSION  11/29/09   with pelvic hematoma  . 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 ENDOMETRIAL ABLATION  07/2003   HTA  . KNEE ARTHROSCOPY Left 07/2003    Family History  Problem Relation Age of Onset  . Arthritis Mother   . Hypertension Mother   . Arthritis Father   . Hypertension Father   . Diabetes Father        type ll  . Heart disease Father        a fib  . Cancer Maternal Grandmother        lung  . Hypertension Maternal Grandmother   . Breast cancer Neg Hx     Social History   Socioeconomic History  . Marital status: Media planner    Spouse name: Not on file  . Number of children: Not on file  . Years of education: Not on file  . Highest education level: Not on file  Occupational History  . Not on file  Tobacco Use  . Smoking status: Never Smoker  . Smokeless tobacco: Never Used  Vaping Use  .  Vaping Use: Never used  Substance and Sexual Activity  . Alcohol use: Yes    Alcohol/week: 5.0 - 7.0 standard drinks    Types: 5 - 7 Glasses of wine per week  . Drug use: No  . Sexual activity: Yes    Partners: Male    Birth control/protection: Post-menopausal, Other-see comments    Comment: ablation  Other Topics Concern  . Not on file  Social History Narrative   Husband age 17 passed from a work accident 04/2009.     Has 2 children son and daughter.   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 Graduated 05/2016.    Social Determinants of Health   Financial Resource Strain:   . Difficulty of Paying Living Expenses: Not on file  Food Insecurity:   . Worried About Programme researcher, broadcasting/film/video in the Last Year: Not on file  . Ran Out of Food in the Last Year: Not on file  Transportation Needs:   . Lack of Transportation (Medical): Not on file  . Lack of Transportation (Non-Medical): Not on file  Physical Activity:   . Days of Exercise per Week: Not on file  . Minutes of Exercise per Session: Not on file  Stress:   . Feeling of Stress : Not on file  Social Connections:   . Frequency of Communication with Friends and Family: Not on file  . Frequency of Social Gatherings with Friends and Family: Not on file  . Attends Religious Services: Not on file  . Active Member of Clubs or Organizations: Not on file  . Attends Banker Meetings: Not on file  . Marital Status: Not on file  Intimate Partner Violence:   . Fear of Current or Ex-Partner: Not on file  . Emotionally Abused: Not on file  . Physically Abused: Not on file  . Sexually Abused: Not on file    Outpatient Medications Prior to Visit  Medication Sig Dispense Refill  . BLACK COHOSH EXTRACT PO Take by mouth.    Marland Kitchen buPROPion (WELLBUTRIN XL) 300 MG 24 hr tablet Take 1 tablet (300 mg total) by mouth daily. 90 tablet 4  . citalopram (CELEXA) 40 MG tablet Take 1 tablet (40 mg total) by mouth  daily. 90 tablet 4  . EVENING PRIMROSE OIL PO Take by mouth.    . Multiple Vitamin (MULTIVITAMIN) tablet Take 1 tablet by mouth daily.     No facility-administered medications prior to visit.    Allergies  Allergen Reactions  . Macrobid [employer] Macro] Other (See Comments)    Itching, hot hands and feet, increased with two doses  . Sulfa Antibiotics     Yeast infection occurs    ROS Review of Systems  Constitutional: Negative for chills and fever.  Gastrointestinal: Positive for abdominal pain. Negative for nausea and vomiting.  Genitourinary: Negative for dysuria.      Objective:    Physical Exam Vitals reviewed.  Constitutional:      Appearance: Normal appearance.  Cardiovascular:     Rate and Rhythm: Normal rate and regular rhythm.  Pulmonary:     Effort: Pulmonary effort is normal.     Breath sounds: Normal breath sounds.  Abdominal:     Comments: Normal bowel sounds.  Abdomen is soft with tenderness in the sigmoid region.  No guarding or rebound.  No masses palpated.  No abdominal distention  Neurological:     Mental Status: She is alert.     BP 110/80   Pulse 60   Temp 98.4 F (36.9 C)   Ht 5\' 5"  (1.651 m)   Wt 175 lb 4.8 oz (79.5 kg)   LMP 08/20/2008 (Approximate)   SpO2 97%   BMI 29.17 kg/m  Wt Readings from Last 3 Encounters:  11/02/19 175 lb 4.8 oz (79.5 kg)  03/01/19 173 lb 8 oz (78.7 kg)  02/25/19 174 lb (78.9 kg)     Health Maintenance Due  Topic Date Due  . INFLUENZA VACCINE  08/21/2019    There are no preventive care reminders to display for this patient.  Lab Results  Component Value Date   TSH 2.40 03/01/2019   Lab Results  Component Value Date   WBC 6.3 03/01/2019   HGB 14.3 03/01/2019   HCT 42.4 03/01/2019   MCV 94.9 03/01/2019   PLT 292.0 03/01/2019   Lab Results  Component Value Date   NA 138 03/01/2019   K 4.4 03/01/2019   CO2 28 03/01/2019   GLUCOSE 72 03/01/2019   BUN 16 03/01/2019   CREATININE  0.71 03/01/2019   BILITOT 0.7  03/01/2019   ALKPHOS 69 03/01/2019   AST 18 03/01/2019   ALT 17 03/01/2019   PROT 6.8 03/01/2019   ALBUMIN 4.3 03/01/2019   CALCIUM 9.3 03/01/2019   GFR 83.45 03/01/2019   Lab Results  Component Value Date   CHOL 191 03/01/2019   Lab Results  Component Value Date   HDL 102.60 03/01/2019   Lab Results  Component Value Date   LDLCALC 76 03/01/2019   Lab Results  Component Value Date   TRIG 61.0 03/01/2019   Lab Results  Component Value Date   CHOLHDL 2 03/01/2019   No results found for: HGBA1C    Assessment & Plan:   Abdominal pain left lower quadrant.  History of known sigmoid diverticulosis.  Suspect acute sigmoid diverticulitis.  No acute abdomen.  -Start Cipro 500 mg twice daily for 10 days and Flagyl 500 mg 3 times daily for 10 days -Follow-up promptly for any fever or any persistent or worsening abdominal pain  Meds ordered this encounter  Medications  . ciprofloxacin (CIPRO) 500 MG tablet    Sig: Take 1 tablet (500 mg total) by mouth 2 (two) times daily.    Dispense:  20 tablet    Refill:  0  . metroNIDAZOLE (FLAGYL) 500 MG tablet    Sig: Take 1 tablet (500 mg total) by mouth 3 (three) times daily.    Dispense:  30 tablet    Refill:  0    Follow-up: No follow-ups on file.    Evelena Peat, MD

## 2019-11-22 ENCOUNTER — Ambulatory Visit: Payer: BC Managed Care – PPO | Admitting: Obstetrics & Gynecology

## 2019-11-22 ENCOUNTER — Encounter: Payer: Self-pay | Admitting: Obstetrics & Gynecology

## 2019-11-22 ENCOUNTER — Other Ambulatory Visit: Payer: Self-pay

## 2019-11-22 ENCOUNTER — Other Ambulatory Visit (HOSPITAL_COMMUNITY)
Admission: RE | Admit: 2019-11-22 | Discharge: 2019-11-22 | Disposition: A | Discharge status: Home / Self Care | Payer: BC Managed Care – PPO | Source: Ambulatory Visit | Attending: Obstetrics & Gynecology | Admitting: Obstetrics & Gynecology

## 2019-11-22 VITALS — BP 118/62 | HR 68 | Resp 14 | Ht 65.0 in | Wt 174.0 lb

## 2019-11-22 DIAGNOSIS — N893 Dysplasia of vagina, unspecified: Secondary | ICD-10-CM | POA: Diagnosis not present

## 2019-11-22 DIAGNOSIS — R87610 Atypical squamous cells of undetermined significance on cytologic smear of cervix (ASC-US): Secondary | ICD-10-CM

## 2019-11-22 MED ORDER — ESTRADIOL 0.1 MG/GM VA CREA: TOPICAL_CREAM | VAGINAL | 2 refills | Status: AC

## 2019-11-22 NOTE — Progress Notes (Signed)
GYNECOLOGY  VISIT  HPI: 64 y.o. [id number] Domestic Partner White or Caucasian female here for 6 month follow-up.  Pap smears, colposcopic biopsies and pathology are as follows.  1/17:  H/o ASCUS H pap with +HR HPV 01/2015 2/17:  Colpo with features suggestive of HPV cytopathic effect 8/17:  ASCUS-H 9/17:  Colpo with LGSIL findings 10/12/15:  LEEP with CIN 1 and HPV cytopathic effect 04/03/16:  Normal pap 10/18:  Pap neg with +HR HPV, neg 16/18/45 12/18:  Colpo, neg ECC, vaginal biopsy with VAIN 1 11/19:  ASCUS with neg HR HPV 12/19:  Colpo with neg ECC 02/18/19:  ASCUS pap with +HR HPV 02/25/19:  Colpo with ECC showing possible LGSIL, vaginal biopsy VAIN 1  Denies vaginal bleeding.  Is having a lot of vaginal dryness and some discomfort.  Would like to consider options.  Vaginal creams, vagifem, OTC products discussed.  She is interest in estrogen cream.  Dosing, administration, side effects discussed.    Their house in [place] is scheduled to be completed in March, April, or May.  They will likely move in the spring.  She thought it would be earlier and that they would have established themselves in [place] earlier in 2022.  Will be in [place]. Lenise [place].    At prior visit, we discussed she would be moving and decided to have a 6 month follow up pap smear although by current guidelines, 1 year follow up would be recommended.  Pt and I both felt more comfortable with a six month visit.  It's actually now 8 months     GYNECOLOGIC HISTORY: Patient's last menstrual period was 08/20/2008 (approximate). Contraception: Postmenopausal Menopausal hormone therapy: none  Patient Active Problem List   Diagnosis Date Noted  . 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 Tibialis tenosynovitis 01/04/2015  . Pain in lower limb 01/04/2015  . Migraine with aura   . Moderate episode of recurrent major depressive disorder (HCC) 01/15/2009  . Avitaminosis D 05/12/2008    Past Medical History:   Diagnosis Date  . Arthritis   . Depression   . Migraine with aura     Past Surgical History:  Procedure Laterality Date  . BLADDER SUSPENSION  11/29/09   with pelvic hematoma  . 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 ENDOMETRIAL ABLATION  07/2003   HTA  . KNEE ARTHROSCOPY Left 07/2003    MEDS:   Current Outpatient Medications on File Prior to Visit  Medication Sig Dispense Refill  . buPROPion (WELLBUTRIN XL) 300 MG 24 hr tablet Take 1 tablet (300 mg total) by mouth daily. 90 tablet 4  . citalopram (CELEXA) 40 MG tablet Take 1 tablet (40 mg total) by mouth daily. 90 tablet 4  . Multiple Vitamin (MULTIVITAMIN) tablet Take 1 tablet by mouth daily.     No current facility-administered medications on file prior to visit.    ALLERGIES: Macrobid [nitrofurantoin monohyd macro] and Sulfa antibiotics  Family History  Problem Relation Age of Onset  . Arthritis Mother   . Hypertension Mother   . Arthritis Father   . Hypertension Father   . Diabetes Father        type ll  . Heart disease Father        a fib  . Cancer Maternal Grandmother        lung  . Hypertension Maternal Grandmother   . Breast cancer Neg Hx     SH:  Partnered, non smoker  Review of  Systems  All other systems reviewed and are negative.   PHYSICAL EXAMINATION:    BP 118/62 (BP Location: Right Arm, Patient Position: Sitting, Cuff Size: Normal)   Pulse 68   Resp 14   Ht 5\' 5"  (1.651 m)   Wt 174 lb (78.9 kg)   LMP 08/20/2008 (Approximate)   BMI 28.96 kg/m     General appearance: alert, cooperative and appears stated age Lymph:  no inguinal LAD noted  Pelvic: External genitalia:  no lesions              Urethra:  normal appearing urethra with no masses, tenderness or lesions              Bartholins and Skenes: normal                 Vagina: normal appearing vagina with normal color and discharge, no lesions              Cervix: no lesions              Bimanual Exam:  Uterus:  normal size, contour,  position, consistency, mobility, non-tender              Adnexa: no mass, fullness, tenderness  Chaperone, 10/20/2008, RN, was present for exam.  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  Results will be called to pt and follow up planned.     23 minutes of total time was spent for this patient encounter, including preparation, face-to-face counseling with the patient and coordination of care, and documentation of the encounter.

## 2019-11-26 LAB — CYTOLOGY - PAP: Diagnosis: HIGH — AB

## 2019-11-28 ENCOUNTER — Other Ambulatory Visit: Payer: Self-pay | Admitting: *Deleted

## 2019-11-28 DIAGNOSIS — R87613 High grade squamous intraepithelial lesion on cytologic smear of cervix (HGSIL): Secondary | ICD-10-CM

## 2019-11-29 ENCOUNTER — Telehealth: Payer: Self-pay

## 2019-11-29 NOTE — Telephone Encounter (Signed)
Call to patient. Per DPR, OK to leave message on voicemail.   Left voicemail requesting a return call to Therisa Mennella to review benefits for scheduled Colposcopy with [name], MD. 

## 2019-11-30 NOTE — Telephone Encounter (Signed)
Spoke with patient regarding benefits for scheduled Colposcopy. Patient acknowledges understanding of information presented. Encounter closed. 

## 2019-12-06 ENCOUNTER — Encounter: Payer: Self-pay | Admitting: Obstetrics and Gynecology

## 2019-12-06 ENCOUNTER — Ambulatory Visit: Payer: BC Managed Care – PPO | Admitting: Obstetrics and Gynecology

## 2019-12-06 ENCOUNTER — Other Ambulatory Visit: Payer: Self-pay

## 2019-12-06 VITALS — BP 122/76 | HR 76 | Ht 65.0 in | Wt 179.0 lb

## 2019-12-06 DIAGNOSIS — Z87411 Personal history of vaginal dysplasia: Secondary | ICD-10-CM

## 2019-12-06 DIAGNOSIS — R87613 High grade squamous intraepithelial lesion on cytologic smear of cervix (HGSIL): Secondary | ICD-10-CM | POA: Diagnosis not present

## 2019-12-06 DIAGNOSIS — Z87410 Personal history of cervical dysplasia: Secondary | ICD-10-CM

## 2019-12-06 DIAGNOSIS — N882 Stricture and stenosis of cervix uteri: Secondary | ICD-10-CM | POA: Diagnosis not present

## 2019-12-06 NOTE — Progress Notes (Signed)
GYNECOLOGY  VISIT   HPI: 64 y.o.   Married White or Caucasian Not Hispanic or Latino  female   [phone] with Patient's last menstrual period was 08/20/2008 (approximate).   here for colposcopy.      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 Contraception:PMP Menopausal hormone therapy: estradiol         OB History    Gravida  2   Para  2   Term  2   Preterm  0   AB  0   Living  2     SAB  0   TAB  0   Ectopic  0   Multiple  0   Live Births  2              Patient Active Problem List   Diagnosis Date Noted  . 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 Tibialis tenosynovitis 01/04/2015  . Pain in lower limb 01/04/2015  . Migraine with aura   . Moderate episode of recurrent major depressive disorder (HCC) 01/15/2009  . Avitaminosis D 05/12/2008    Past Medical History:  Diagnosis Date  . Arthritis   . Depression   . Migraine with aura     Past Surgical History:  Procedure Laterality Date  . BLADDER SUSPENSION  11/29/09   with pelvic hematoma  . 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 ENDOMETRIAL ABLATION  07/2003   HTA  . KNEE ARTHROSCOPY Left 07/2003    Current Outpatient Medications  Medication Sig Dispense Refill  . buPROPion (WELLBUTRIN XL) 300 MG 24 hr tablet Take 1 tablet (300 mg total) by mouth daily. 90 tablet 4  . citalopram (CELEXA) 40 MG tablet Take 1  tablet (40 mg total) by mouth daily. 90 tablet 4  . estradiol (ESTRACE) 0.1 MG/GM vaginal cream 1 gram vaginally twice weekly 42.5 g 2  . Multiple Vitamin (MULTIVITAMIN) tablet Take 1 tablet by mouth daily.     No current facility-administered medications for this visit.     ALLERGIES: Macrobid [nitrofurantoin monohyd macro] and Sulfa antibiotics  Family History  Problem Relation Age of Onset  . Arthritis Mother   . Hypertension Mother   . Arthritis Father   . Hypertension Father   . Diabetes Father        type ll  . Heart disease Father        a fib  . Cancer Maternal Grandmother        lung  . Hypertension Maternal Grandmother   . Breast cancer Neg Hx     Social History   Socioeconomic History  . Marital status: Married    Spouse name: Not on file  . Number of children: Not on file  . Years of education: Not on file  . Highest education level: Not on file  Occupational History  . Not  on file  Tobacco Use  . Smoking status: Never Smoker  . Smokeless tobacco: Never Used  Vaping Use  . Vaping Use: Never used  Substance and Sexual Activity  . Alcohol use: Yes    Alcohol/week: 5.0 - 7.0 standard drinks    Types: 5 - 7 Glasses of wine per week  . Drug use: No  . Sexual activity: Yes    Partners: Male    Birth control/protection: Post-menopausal, Other-see comments    Comment: ablation  Other Topics Concern  . Not on file  Social History Narrative   Husband age 9 passed from a work accident 04/2009.     Has 2 children son and daughter.   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 Graduated 05/2016.    Social Determinants of Health   Financial Resource Strain:   . Difficulty of Paying Living Expenses: Not on file  Food Insecurity:   . Worried About Programme researcher, broadcasting/film/video in the Last Year: Not on file  . Ran Out of Food in the Last Year: Not on file  Transportation Needs:   . Lack of Transportation (Medical): Not on file  . Lack of  Transportation (Non-Medical): Not on file  Physical Activity:   . Days of Exercise per Week: Not on file  . Minutes of Exercise per Session: Not on file  Stress:   . Feeling of Stress : Not on file  Social Connections:   . Frequency of Communication with Friends and Family: Not on file  . Frequency of Social Gatherings with Friends and Family: Not on file  . Attends Religious Services: Not on file  . Active Member of Clubs or Organizations: Not on file  . Attends Banker Meetings: Not on file  . Marital Status: Not on file  Intimate Partner Violence:   . Fear of Current or Ex-Partner: Not on file  . Emotionally Abused: Not on file  . Physically Abused: Not on file  . Sexually Abused: Not on file    Review of Systems  All other systems reviewed and are negative.   PHYSICAL EXAMINATION:    BP 122/76   Pulse 76   Ht 5\' 5"  (1.651 m)   Wt 179 lb (81.2 kg)   LMP 08/20/2008 (Approximate)   SpO2 97%   BMI 29.79 kg/m     General appearance: alert, cooperative and appears stated age  Pelvic: External genitalia:  no lesions              Urethra:  normal appearing urethra with no masses, tenderness or lesions              Bartholins and Skenes: normal                 Vagina: normal appearing vagina with normal color and discharge, no lesions              Cervix: no lesions and stenotic               Colposcopy: not satisfactory.  Cervix stenotic. No aceto-white changes.  Tenaculum placed at 12 o'clock, paracervical block with 1% lidocaine at 4 and 8 o'clock. The cervix was dilated with the mini-dilators to the #4 hagar dilator. ECC then performed. Lugols applied to cervix and upper vagina, several areas in the vagina with decreased lugols uptake. Vaginal biopsies at 3 and 7 o'clock. Areas were injected with local anesthesia prior to the biopsies. Biopsy sites treated with  silver nitrate and monsels.   Chaperone was present for exam.  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 ECC required cervical dilation Further plans depending on results

## 2019-12-08 ENCOUNTER — Other Ambulatory Visit: Payer: Self-pay

## 2019-12-08 ENCOUNTER — Other Ambulatory Visit (HOSPITAL_COMMUNITY)
Admission: RE | Admit: 2019-12-08 | Discharge: 2019-12-08 | Disposition: A | Discharge status: Home / Self Care | Payer: BC Managed Care – PPO | Source: Ambulatory Visit | Attending: Obstetrics and Gynecology | Admitting: Obstetrics and Gynecology

## 2019-12-08 DIAGNOSIS — R87613 High grade squamous intraepithelial lesion on cytologic smear of cervix (HGSIL): Secondary | ICD-10-CM | POA: Insufficient documentation

## 2019-12-08 DIAGNOSIS — Z87410 Personal history of cervical dysplasia: Secondary | ICD-10-CM | POA: Diagnosis not present

## 2019-12-08 DIAGNOSIS — Z87411 Personal history of vaginal dysplasia: Secondary | ICD-10-CM | POA: Diagnosis not present

## 2019-12-08 DIAGNOSIS — N870 Mild cervical dysplasia: Secondary | ICD-10-CM | POA: Diagnosis not present

## 2019-12-08 DIAGNOSIS — N891 Moderate vaginal dysplasia: Secondary | ICD-10-CM | POA: Diagnosis not present

## 2019-12-08 DIAGNOSIS — N890 Mild vaginal dysplasia: Secondary | ICD-10-CM | POA: Diagnosis not present

## 2019-12-08 DIAGNOSIS — D072 Carcinoma in situ of vagina: Secondary | ICD-10-CM | POA: Diagnosis not present

## 2019-12-09 ENCOUNTER — Encounter: Payer: Self-pay | Admitting: Obstetrics and Gynecology

## 2019-12-13 ENCOUNTER — Telehealth: Payer: Self-pay

## 2019-12-13 LAB — SURGICAL PATHOLOGY

## 2019-12-13 NOTE — Telephone Encounter (Signed)
Left message for pt to return call to triage RN. 

## 2019-12-13 NOTE — Telephone Encounter (Signed)
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 Her cervical biopsy with CIN I. The recommendation is for treatment. The area is too large to remove. I would recommend laser ablation in the OR. Given her severe cervical stenosis and difficulty getting an ECC, with her recurrent cervical dysplasia I would recommend a repeat leep in the OR. Even with treatment there is a high risk of recurrence of vaginal dysplasia and she will need f/u. I think this is her best option for treatment. See if she wants to set up an appointment to discuss.

## 2019-12-14 NOTE — Telephone Encounter (Signed)
Pt returned call. Spoke with pt. Pt given results and recommendations per Dr Oscar La. Pt agreeable and verbalized understanding for OR procedure. Pt has no further questions. Pt prefers procedure to be done in Dec due to insurance change in Jan 2022.   Reviewed with Dr Oscar La, Pt ok to proceed in Dec for procedures and no pre-op needed.   Routing to [facility] for precert.

## 2019-12-14 NOTE — Telephone Encounter (Signed)
Left message with pt's husband per DPR. Husband states will have pt return call to office for results.

## 2019-12-19 NOTE — Telephone Encounter (Signed)
Patient returning call regarding surgery benefits and scheduling. Patient stated that she will have a new insurance as of January 2022 and our office will no longer be in network. Patient is wanting to know how urgent this surgery is, can it wait until after the new year, and whether she has a high probability of needing a TLH after this procedure.  Informed patient that I would get her questions to Dr. Oscar La to review and will return call with additional information.  Cc: Carmelina Dane as FYI.

## 2019-12-19 NOTE — Telephone Encounter (Signed)
Left message for pt to return call to triage RN. 

## 2019-12-19 NOTE — Telephone Encounter (Signed)
Call to patient. Per DPR, OK to leave message on voicemail.   Left voicemail requesting a return call to Amy Davila to review benefits and schedule recommended surgery with [name], MD. 

## 2019-12-19 NOTE — Telephone Encounter (Signed)
I think her surgery can wait until the new year (a delay of a few months shouldn't make a difference). If the dysplasia can be cleared up she won't need a TLH. Vaginal dysplasia can recur. If after a repeat leep she has continued cervical stenosis and difficulty in evaluating her cervix in combination with abnormal paps, she may end up needing a hysterectomy.

## 2019-12-21 NOTE — Telephone Encounter (Signed)
Left detailed message per DPR. Pt given recommendations per Dr Oscar La. Pt to return call with any questions or concerns and to call back in Jan 2022 to schedule surgery procedure.  Encounter closed

## 2020-01-04 DIAGNOSIS — Z881 Allergy status to other antibiotic agents status: Secondary | ICD-10-CM | POA: Diagnosis not present

## 2020-01-04 DIAGNOSIS — Z9889 Other specified postprocedural states: Secondary | ICD-10-CM | POA: Diagnosis not present

## 2020-01-04 DIAGNOSIS — D072 Carcinoma in situ of vagina: Secondary | ICD-10-CM | POA: Diagnosis not present

## 2020-01-04 DIAGNOSIS — Z882 Allergy status to sulfonamides status: Secondary | ICD-10-CM | POA: Diagnosis not present

## 2020-01-04 DIAGNOSIS — N870 Mild cervical dysplasia: Secondary | ICD-10-CM | POA: Diagnosis not present

## 2020-02-10 IMAGING — MG DIGITAL SCREENING BILATERAL MAMMOGRAM WITH TOMO AND CAD
8 series · 9 of 24 positions shown · non-contrast
Comparison: Previous exam(s).

CLINICAL DATA: Screening.

EXAM:
DIGITAL SCREENING BILATERAL MAMMOGRAM WITH TOMO AND CAD

[R MLO synth-2D]
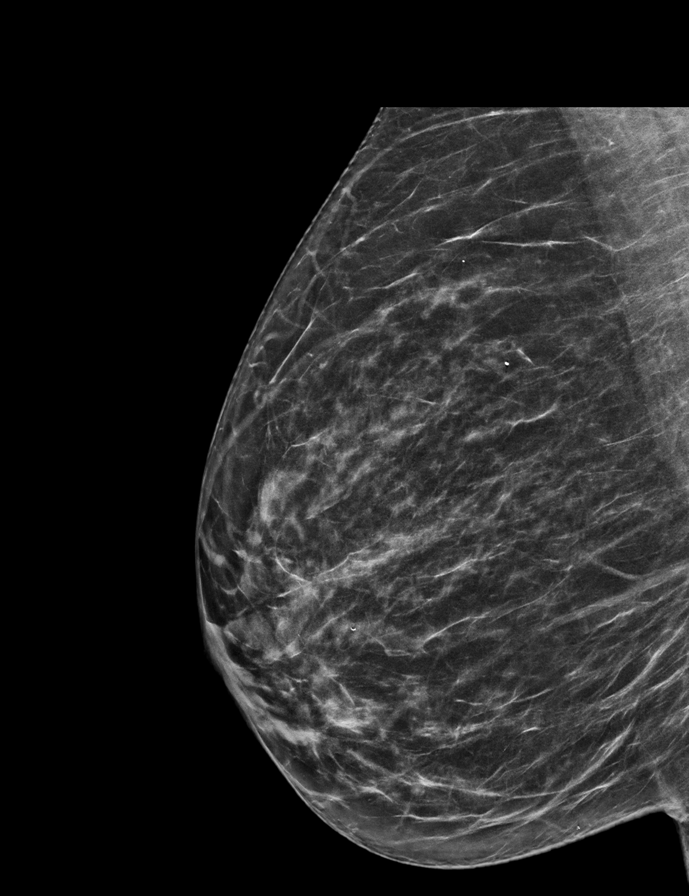

[L MLO synth-2D]
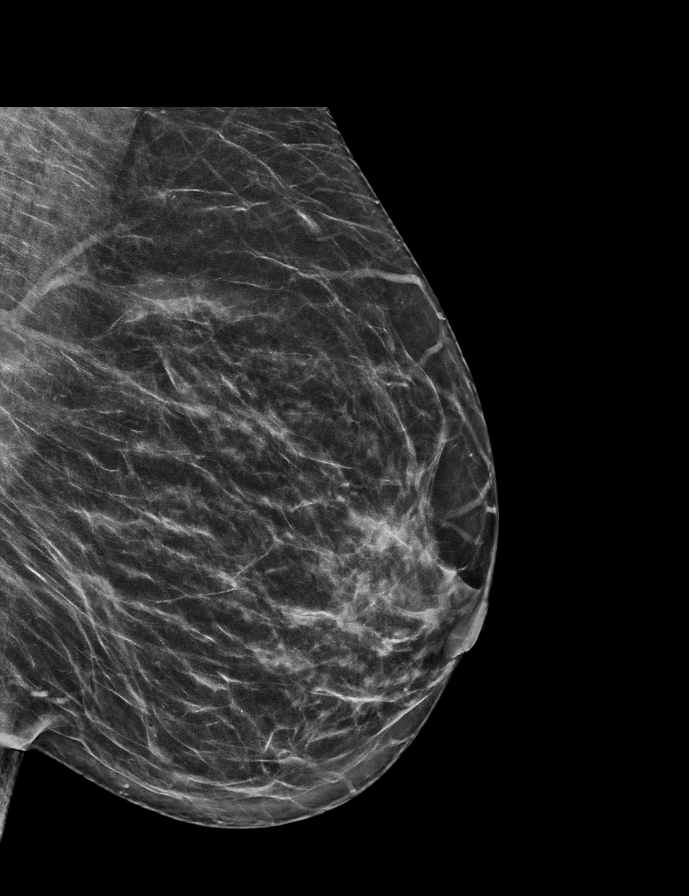

[R CC synth-2D]
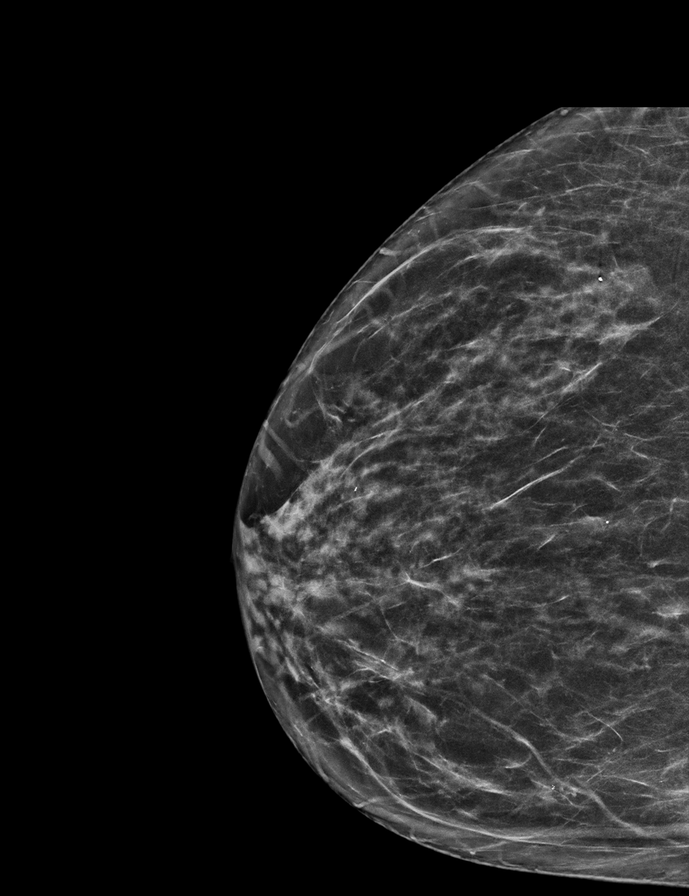

[L CC synth-2D]
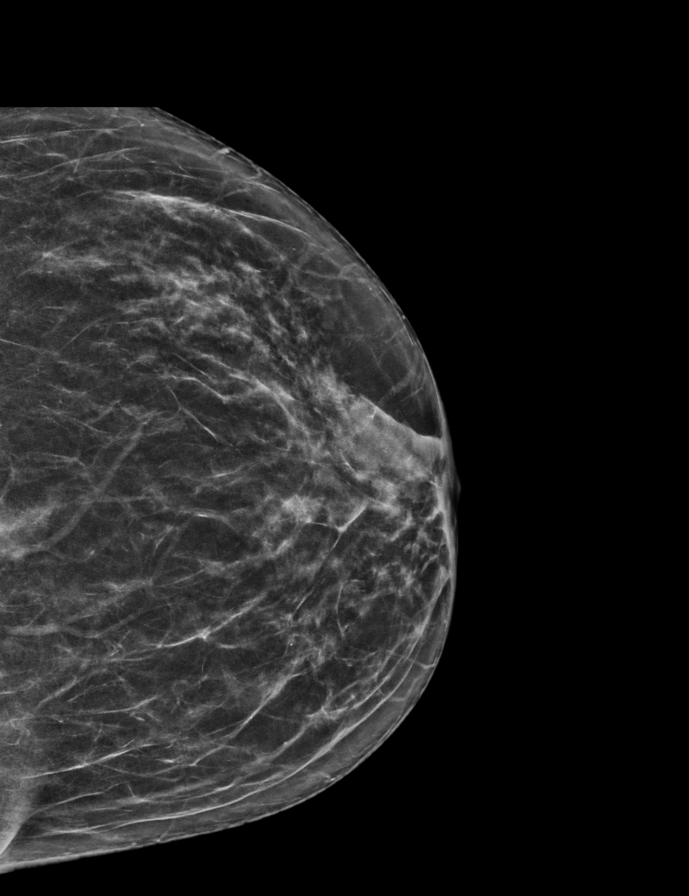

[R CC tomo · 2 of 62 frames shown]
[frame 21/62]
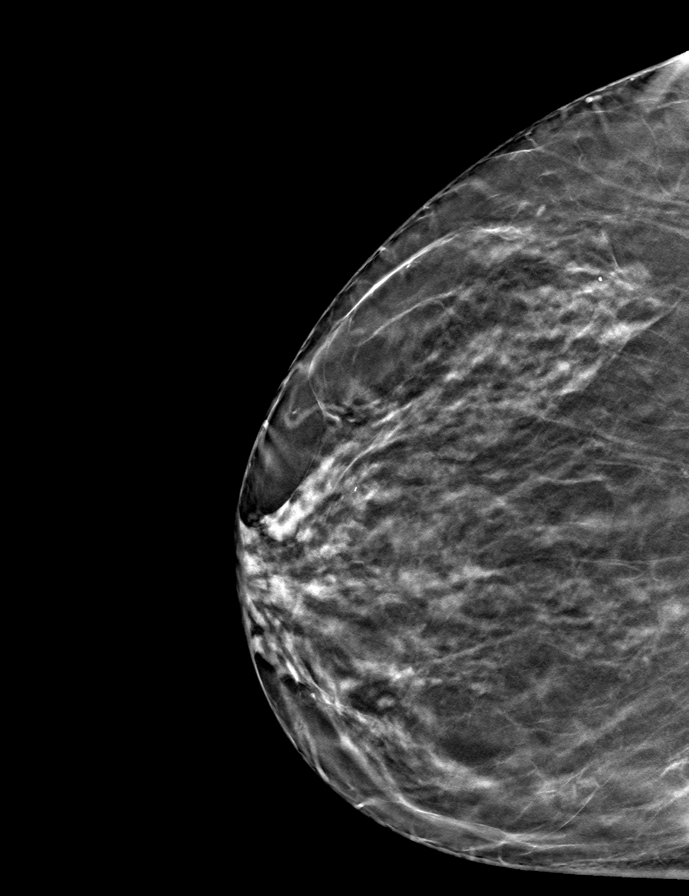
[frame 31/62]
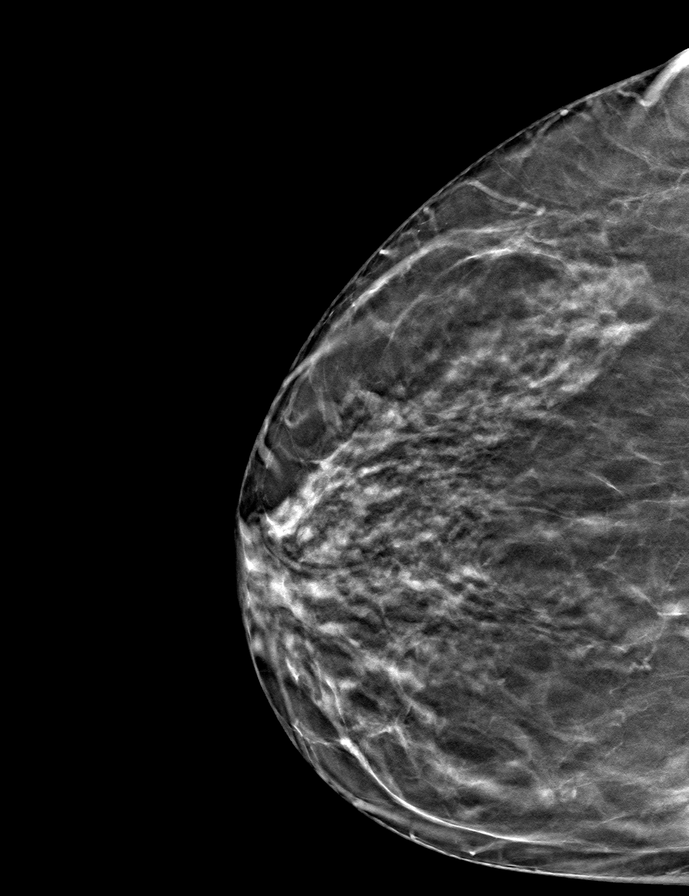

[R MLO tomo · tomo slice 33/65.0]
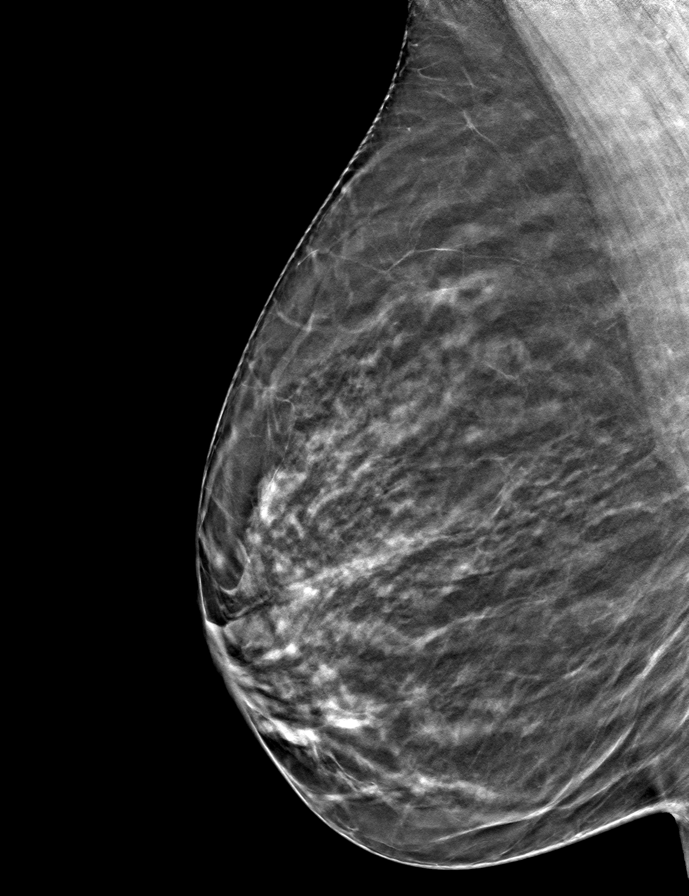

[L MLO tomo · tomo slice 33/65.0]
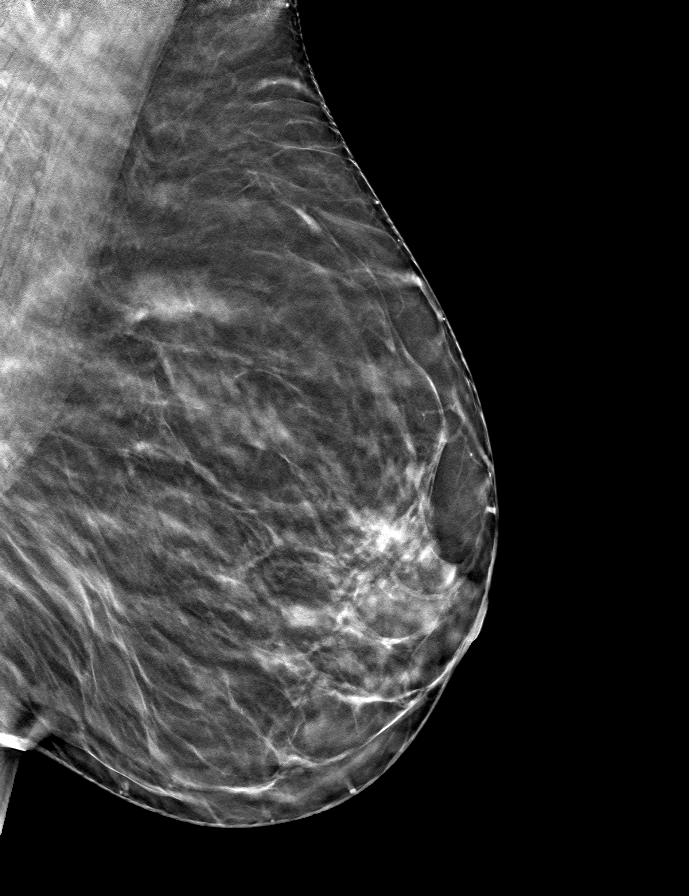

[L CC tomo · tomo slice 29/57.0]
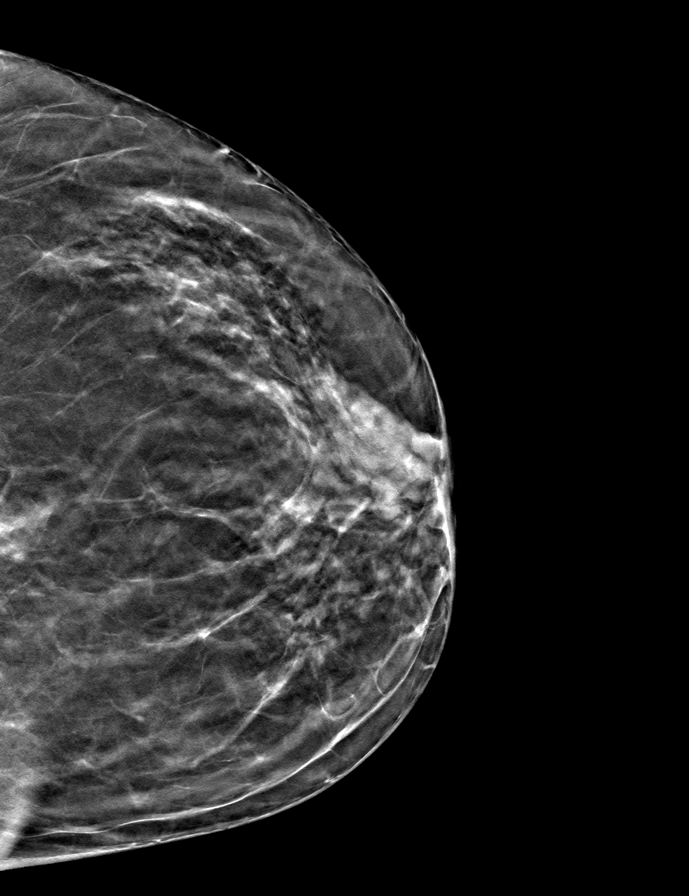

[9 of 24 positions shown; findings below may reference images not displayed]

ACR Breast Density Category b: There are scattered areas of
fibroglandular density.
FINDINGS: There are no findings suspicious for malignancy. Images were
processed with CAD.
IMPRESSION: No mammographic evidence of malignancy. A result letter of this
screening mammogram will be mailed directly to the patient.

RECOMMENDATION:
Screening mammogram in one year. (Code:[id number])

BI-RADS CATEGORY  1: Negative.

## 2020-02-21 ENCOUNTER — Ambulatory Visit: Payer: Self-pay | Admitting: Obstetrics & Gynecology

## 2020-03-20 ENCOUNTER — Other Ambulatory Visit: Payer: Self-pay | Admitting: Obstetrics & Gynecology

## 2020-03-22 ENCOUNTER — Encounter (HOSPITAL_BASED_OUTPATIENT_CLINIC_OR_DEPARTMENT_OTHER): Payer: Self-pay

## 2020-05-15 ENCOUNTER — Other Ambulatory Visit: Payer: Self-pay | Admitting: Obstetrics & Gynecology

## 2020-11-22 ENCOUNTER — Telehealth: Payer: Self-pay | Admitting: *Deleted

## 2020-11-22 NOTE — Telephone Encounter (Signed)
Patient in surgery hold for vaginal biopsy high grade dysplasia. Cervical biopsy CIN1. Laser ablation was recommended in OR 11/2019. See 12/13/19 telephone encounter.   CO2 Laser ablation on 02/17/20 at [facility].   Routing to Dr. Shirley Friar.   Encounter closed.

## 2021-05-15 IMAGING — US ULTRASOUND ABDOMEN COMPLETE
1 series · 14 of 25 positions shown · non-contrast
Comparison: None.

CLINICAL DATA: 63-year-old female with UPPER abdominal pain and
diarrhea for several weeks.

EXAM:
ABDOMEN ULTRASOUND COMPLETE

[Series 1: ultrasound abdomen complete · 0.17mm/px · 14 of 85 slices shown]
[im 1/85]
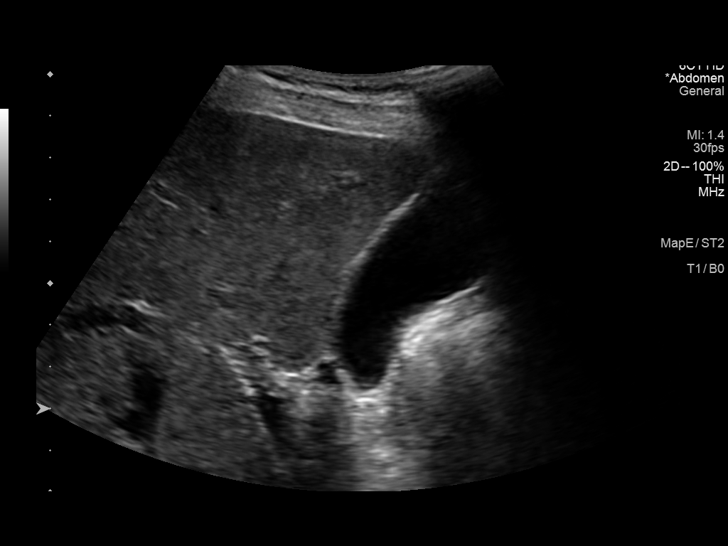
[im 8/85]
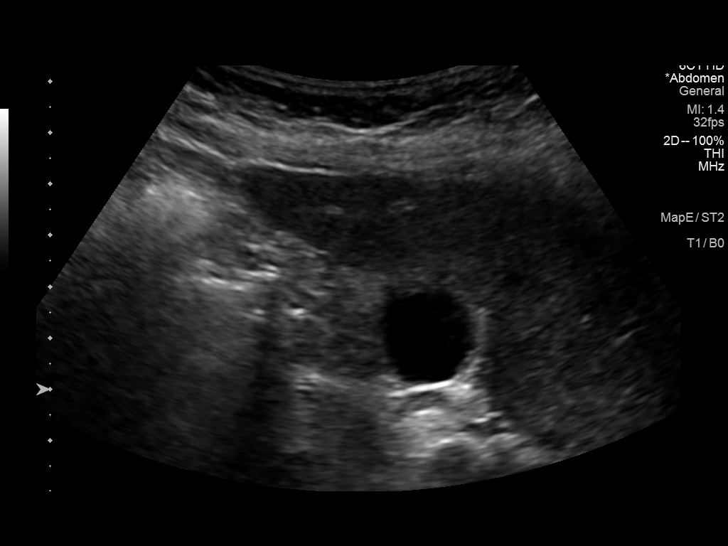
[im 15/85]
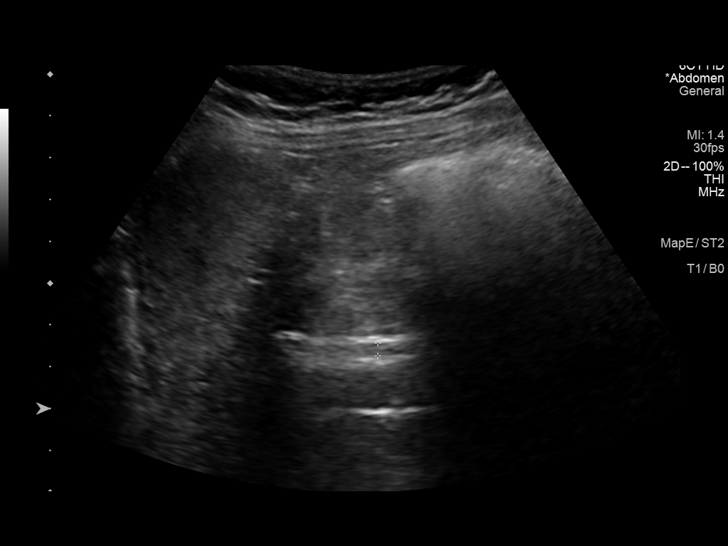
[im 22/85]
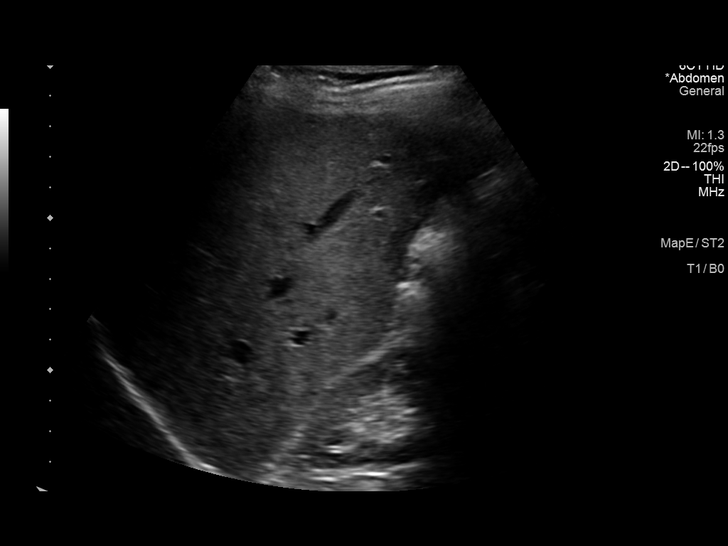
[im 29/85]
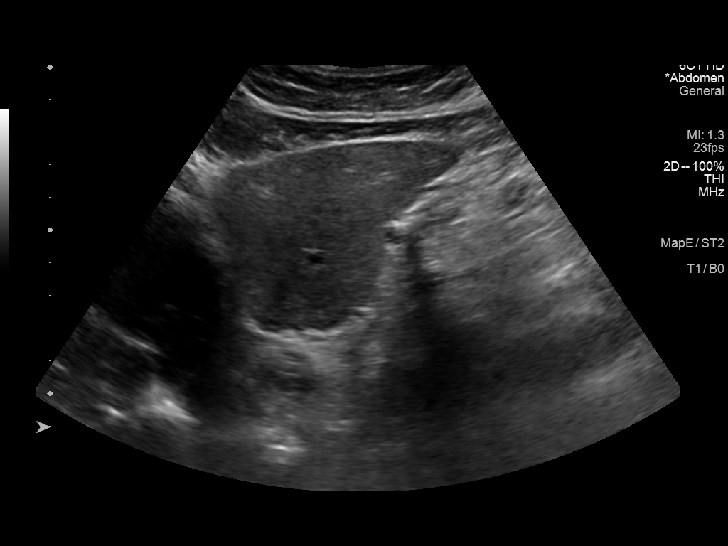
[im 32/85]
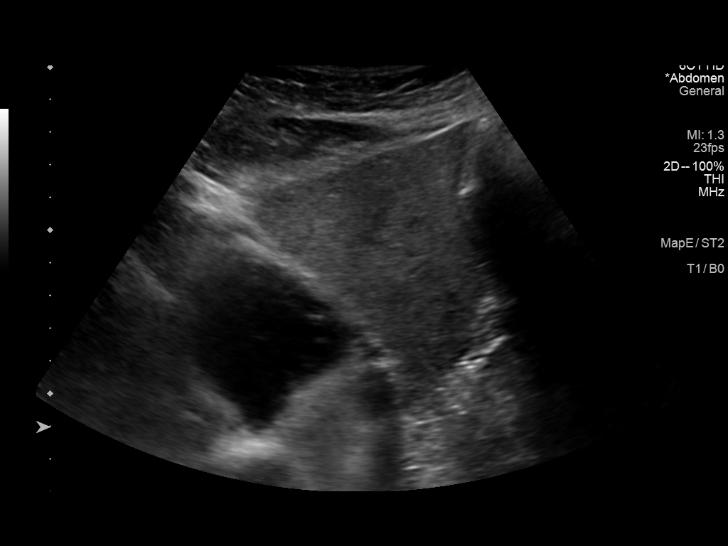
[im 39/85]
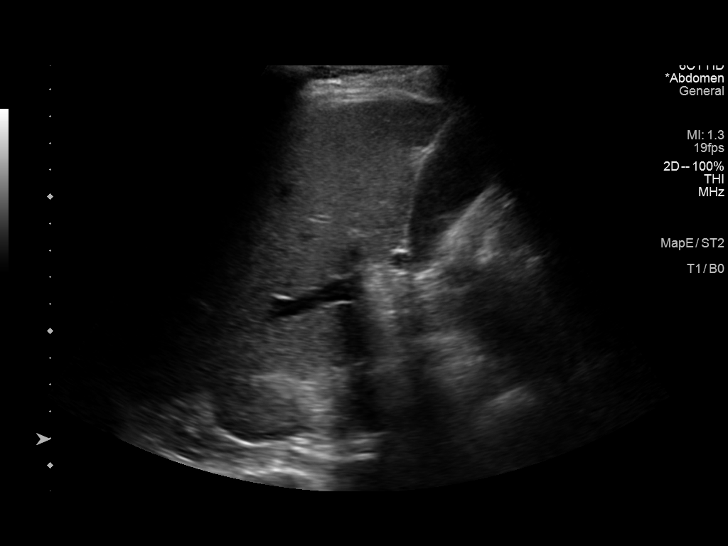
[im 46/85]
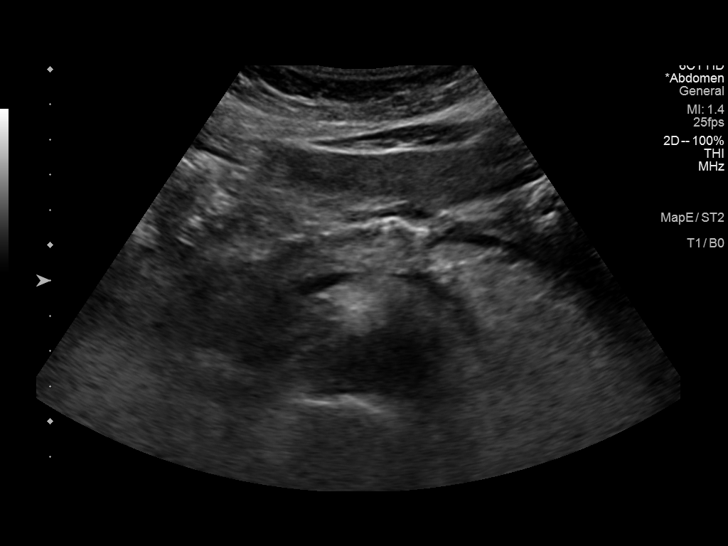
[im 53/85]
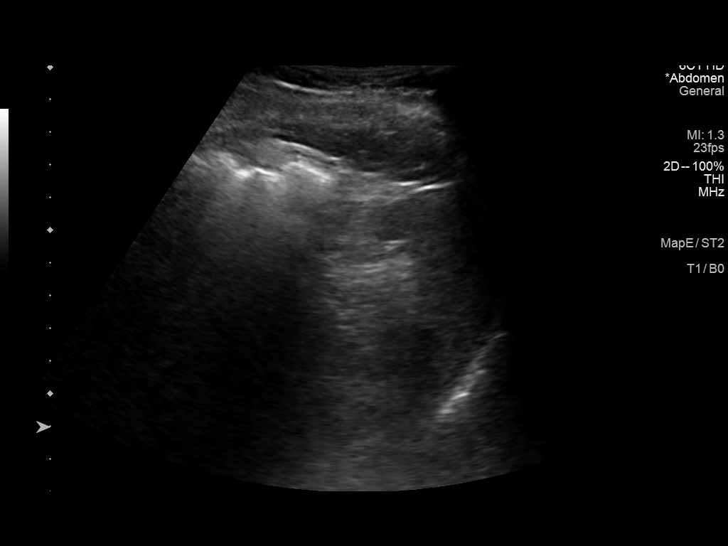
[im 57/85]
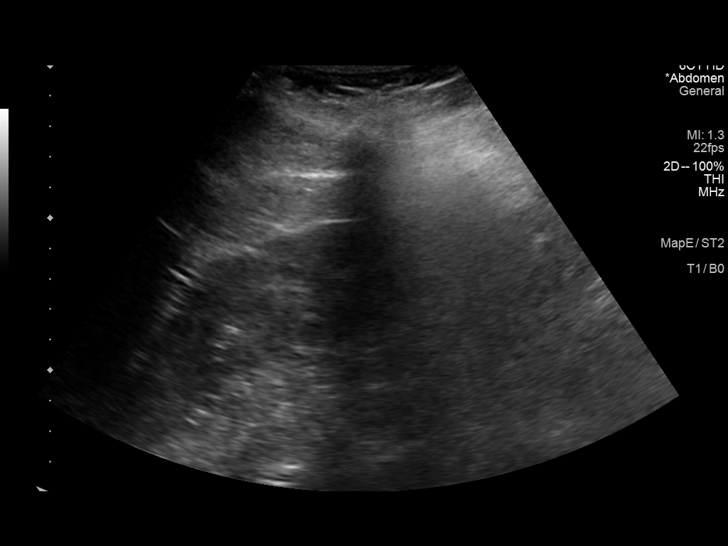
[im 64/85]
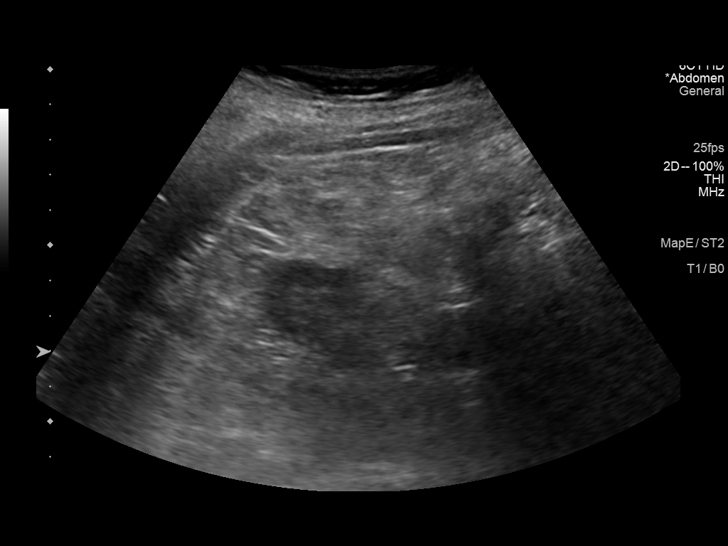
[im 71/85]
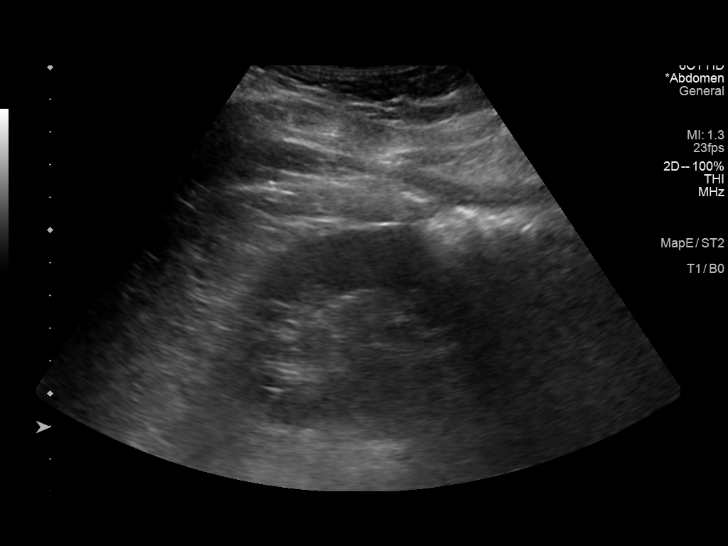
[im 78/85]
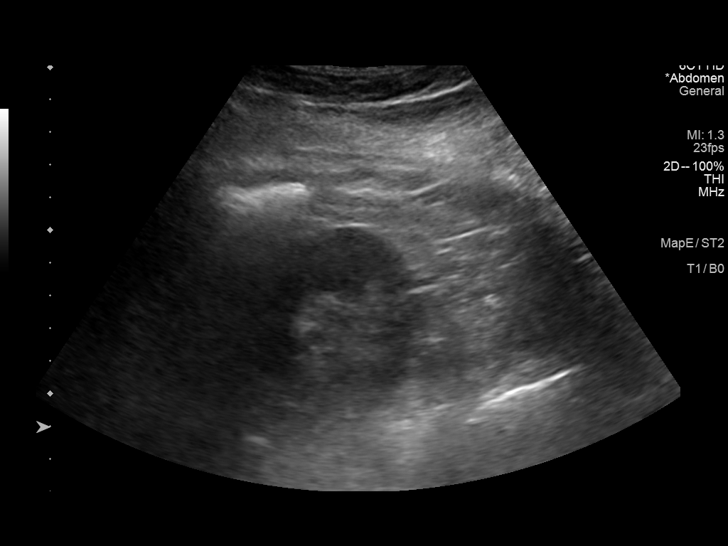
[im 85/85]
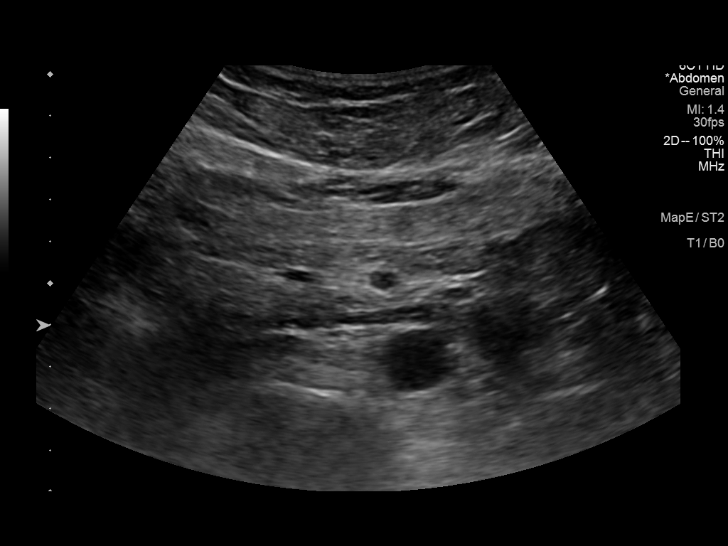

[14 of 25 positions shown; findings below may reference images not displayed]

FINDINGS: Gallbladder: The gallbladder is unremarkable. There is no evidence
of cholelithiasis or acute cholecystitis.

Common bile duct: Diameter: 3 mm. No intrahepatic or extrahepatic
biliary dilatation.

Liver: No focal lesion identified. Within normal limits in
parenchymal echogenicity. Portal vein is patent on color Doppler
imaging with normal direction of blood flow towards the liver.

IVC: No abnormality visualized.

Pancreas: Visualized portion unremarkable.

Spleen: Size and appearance within normal limits.

Right Kidney: Length: 11.3 cm. Echogenicity within normal limits. No
mass or hydronephrosis visualized.

Left Kidney: Length: 10.6 cm. Echogenicity within normal limits. No
mass or hydronephrosis visualized.

Abdominal aorta: No aneurysm visualized.

Other findings: None.
IMPRESSION: Normal abdominal ultrasound.
